# Patient Record
Sex: Male | Born: 1970
Health system: Southern US, Community
[De-identification: ages and names within clinical notes are randomized; demographics above are authoritative.]

## PROBLEM LIST (undated history)

## (undated) DIAGNOSIS — K219 Gastro-esophageal reflux disease without esophagitis: Secondary | ICD-10-CM

## (undated) DIAGNOSIS — N189 Chronic kidney disease, unspecified: Secondary | ICD-10-CM

## (undated) DIAGNOSIS — Z8719 Personal history of other diseases of the digestive system: Secondary | ICD-10-CM

## (undated) DIAGNOSIS — R519 Headache, unspecified: Secondary | ICD-10-CM

## (undated) DIAGNOSIS — Z9889 Other specified postprocedural states: Secondary | ICD-10-CM

## (undated) DIAGNOSIS — M1990 Unspecified osteoarthritis, unspecified site: Secondary | ICD-10-CM

## (undated) DIAGNOSIS — G4730 Sleep apnea, unspecified: Secondary | ICD-10-CM

## (undated) DIAGNOSIS — T8859XA Other complications of anesthesia, initial encounter: Secondary | ICD-10-CM

## (undated) DIAGNOSIS — R51 Headache: Secondary | ICD-10-CM

## (undated) DIAGNOSIS — Z87442 Personal history of urinary calculi: Secondary | ICD-10-CM

## (undated) DIAGNOSIS — T4145XA Adverse effect of unspecified anesthetic, initial encounter: Secondary | ICD-10-CM

## (undated) DIAGNOSIS — R112 Nausea with vomiting, unspecified: Secondary | ICD-10-CM

## (undated) HISTORY — PX: MENISCUS REPAIR: SHX5179

## (undated) HISTORY — PX: ELBOW ARTHROSCOPY: SUR87

## (undated) HISTORY — PX: WISDOM TOOTH EXTRACTION: SHX21

## (undated) HISTORY — PX: CHOLECYSTECTOMY: SHX55

## (undated) HISTORY — PX: OTHER SURGICAL HISTORY: SHX169

## (undated) HISTORY — PX: SHOULDER ARTHROSCOPY: SHX128

---

## 1998-05-16 ENCOUNTER — Encounter: Payer: Self-pay | Admitting: Emergency Medicine

## 1998-05-16 ENCOUNTER — Emergency Department (HOSPITAL_COMMUNITY): Admission: EM | Admit: 1998-05-16 | Discharge: 1998-05-16 | Payer: Self-pay | Admitting: Emergency Medicine

## 1999-11-01 ENCOUNTER — Emergency Department (HOSPITAL_COMMUNITY): Admission: EM | Admit: 1999-11-01 | Discharge: 1999-11-01 | Payer: Self-pay | Admitting: Emergency Medicine

## 1999-11-01 ENCOUNTER — Encounter: Payer: Self-pay | Admitting: Emergency Medicine

## 1999-12-03 ENCOUNTER — Ambulatory Visit (HOSPITAL_BASED_OUTPATIENT_CLINIC_OR_DEPARTMENT_OTHER): Admission: RE | Admit: 1999-12-03 | Discharge: 1999-12-03 | Payer: Self-pay | Admitting: Orthopedic Surgery

## 2001-01-17 ENCOUNTER — Emergency Department (HOSPITAL_COMMUNITY): Admission: EM | Admit: 2001-01-17 | Discharge: 2001-01-17 | Payer: Self-pay | Admitting: Emergency Medicine

## 2001-01-18 ENCOUNTER — Encounter: Payer: Self-pay | Admitting: Emergency Medicine

## 2001-05-22 ENCOUNTER — Encounter: Admission: RE | Admit: 2001-05-22 | Discharge: 2001-05-22 | Payer: Self-pay | Admitting: Family Medicine

## 2001-05-22 ENCOUNTER — Encounter: Payer: Self-pay | Admitting: Family Medicine

## 2004-05-12 ENCOUNTER — Ambulatory Visit (HOSPITAL_COMMUNITY): Admission: RE | Admit: 2004-05-12 | Discharge: 2004-05-12 | Payer: Self-pay | Admitting: Gastroenterology

## 2004-05-12 ENCOUNTER — Encounter (INDEPENDENT_AMBULATORY_CARE_PROVIDER_SITE_OTHER): Payer: Self-pay | Admitting: *Deleted

## 2005-05-06 ENCOUNTER — Emergency Department (HOSPITAL_COMMUNITY): Admission: EM | Admit: 2005-05-06 | Discharge: 2005-05-06 | Payer: Self-pay | Admitting: Emergency Medicine

## 2009-10-13 ENCOUNTER — Emergency Department (HOSPITAL_COMMUNITY): Admission: EM | Admit: 2009-10-13 | Discharge: 2009-10-13 | Payer: Self-pay | Admitting: Emergency Medicine

## 2009-10-14 ENCOUNTER — Ambulatory Visit (HOSPITAL_COMMUNITY): Admission: RE | Admit: 2009-10-14 | Discharge: 2009-10-14 | Payer: Self-pay | Admitting: Emergency Medicine

## 2009-10-20 ENCOUNTER — Ambulatory Visit (HOSPITAL_COMMUNITY): Admission: RE | Admit: 2009-10-20 | Discharge: 2009-10-20 | Payer: Self-pay | Admitting: General Surgery

## 2010-06-28 LAB — HEPATIC FUNCTION PANEL
Alkaline Phosphatase: 63 U/L (ref 39–117)
Bilirubin, Direct: 0 mg/dL (ref 0.0–0.3)
Indirect Bilirubin: 0.3 mg/dL (ref 0.3–0.9)
Total Protein: 7.1 g/dL (ref 6.0–8.3)

## 2010-06-28 LAB — POCT CARDIAC MARKERS
CKMB, poc: <1 ng/mL — ABNORMAL LOW (ref 1.0–8.0)
Troponin i, poc: <0.05 ng/mL (ref 0.00–0.09)

## 2010-06-28 LAB — CBC
MCHC: 33.8 g/dL (ref 30.0–36.0)
Platelets: 235 10*3/uL (ref 150–400)
RDW: 13.3 % (ref 11.5–15.5)
WBC: 9.8 10*3/uL (ref 4.0–10.5)

## 2010-06-28 LAB — DIFFERENTIAL
Basophils Absolute: 0.1 10*3/uL (ref 0.0–0.1)
Basophils Relative: 1 % (ref 0–1)
Lymphocytes Relative: 31 % (ref 12–46)
Neutro Abs: 5.8 10*3/uL (ref 1.7–7.7)
Neutrophils Relative %: 59 % (ref 43–77)

## 2010-06-28 LAB — BASIC METABOLIC PANEL
BUN: 11 mg/dL (ref 6–23)
Calcium: 9.1 mg/dL (ref 8.4–10.5)
Creatinine, Ser: 1.27 mg/dL (ref 0.4–1.5)
GFR calc non Af Amer: >60 mL/min (ref >60)
Potassium: 3.9 mEq/L (ref 3.5–5.1)

## 2010-06-28 LAB — LIPASE, BLOOD: Lipase: 34 U/L (ref 11–59)

## 2010-06-28 LAB — SURGICAL PCR SCREEN: MRSA, PCR: NEGATIVE

## 2010-08-28 NOTE — Op Note (Signed)
[name], Duane Barrera               ACCOUNT NO.:  [id number]   MEDICAL RECORD NO.:  [id number]          PATIENT TYPE:  AMB   LOCATION:  ENDO                         FACILITY:  [facility]   PHYSICIAN:  John C. Madilyn Fireman, M.D.    DATE OF BIRTH:  11-24-1970   DATE OF PROCEDURE:  05/12/2004  DATE OF DISCHARGE:                                 OPERATIVE REPORT   PROCEDURE:  Esophagogastroduodenoscopy with biopsy.   INDICATION FOR PROCEDURE:  Probable gastroesophageal reflux, with some  atypical features, specifically chest pain that has been relatively  refractory to proton pump inhibitor.  He has had negative cardiac workup and  abdominal ultrasound.  Procedure is to increase diagnostic certainty  regarding esophageal source of his chest pain.   PROCEDURE:  The patient was placed in the left lateral decubitus position  and placed on the pulse monitor, with continuous low-flow oxygen delivered  by nasal cannula.  He was sedated with 75 mcg IV fentanyl and 10 mg IV  Versed.  The Olympus video endoscope was advanced under direct vision into  the oropharynx and esophagus.  The esophagus was slightly tortuous but of  normal caliber, with the squamocolumnar line at 37 cm, above a short hiatal  hernia approximately 1.5 cm in length.  The GE junction did appear generally  patulous, and reflux of gastric secretions was seen during the procedure.  Just distal to the Z-line, there was a vaguely delineated erythematous  raised nodule approximately 4-6 mm in diameter.  This was biopsied, with two  or three pieces of tissue obtained.  Otherwise, there were no erosions,  ulcers, or any other visible esophageal or GE junction inflammation.  The  stomach was entered, and a small amount of liquid secretions were suctioned  from the fundus.  Retroflexed view of the cardia did not clearly demonstrate  the hiatal hernia and was otherwise unremarkable.  The fundus and body  appeared normal.  Within the antrum, there  were two or there elevated areas  of mucosa, with slight central indentation with a small amount of central  exudate.  These nodules were fairly nonspecific in appearance.  Two of them  were immediately adjacent to each other in the immediate prepyloric area,  and one of these was biopsied.  The pylorus was nondeformed and easily  allowed passage of the endoscope tip into the duodenum.  Both the bulb and  the second portion were well inspected and appeared to be within normal  limits.  The scope was then withdrawn and the patient returned to the  recovery room in stable condition.  He tolerated the procedure well, and  there were no immediate complications.   IMPRESSION:  1.  Gastroesophageal nodule.  2.  Small gastric nodules.  3.  Hiatal hernia, with patulous gastroesophageal junction.   PLAN:  Await all biopsies, and if negative for neoplasm and Helicobacter  pylori will probably resume proton pump inhibitor at a b.i.d. dosing.      JCH/MEDQ  D:  05/12/2004  T:  05/12/2004  Job:  [id number]   cc:   L. Lupe Carney,  M.D.  [address] [place]  [place] [address]  Fax: [phone]

## 2013-08-27 ENCOUNTER — Ambulatory Visit
Admission: RE | Admit: 2013-08-27 | Discharge: 2013-08-27 | Disposition: A | Discharge status: Home / Self Care | Payer: BC Managed Care – PPO | Source: Ambulatory Visit | Attending: Orthopaedic Surgery | Admitting: Orthopaedic Surgery

## 2013-08-27 ENCOUNTER — Other Ambulatory Visit: Payer: Self-pay | Admitting: Orthopaedic Surgery

## 2013-08-27 DIAGNOSIS — M79604 Pain in right leg: Secondary | ICD-10-CM

## 2013-09-04 ENCOUNTER — Other Ambulatory Visit: Payer: Self-pay | Admitting: Orthopaedic Surgery

## 2013-09-04 DIAGNOSIS — M25561 Pain in right knee: Secondary | ICD-10-CM

## 2013-09-09 ENCOUNTER — Ambulatory Visit
Admission: RE | Admit: 2013-09-09 | Discharge: 2013-09-09 | Disposition: A | Discharge status: Home / Self Care | Payer: BC Managed Care – PPO | Source: Ambulatory Visit | Attending: Orthopaedic Surgery | Admitting: Orthopaedic Surgery

## 2013-09-09 DIAGNOSIS — M25561 Pain in right knee: Secondary | ICD-10-CM

## 2015-04-01 ENCOUNTER — Other Ambulatory Visit: Payer: Self-pay | Admitting: Physician Assistant

## 2015-04-01 NOTE — H&P (Signed)
TOTAL KNEE ADMISSION H&P  Patient is being admitted for right total knee arthroplasty.  Subjective:  Chief Complaint:right knee pain.  HPI: Duane CrutchJohnny L Vandezande, 44 y.o. male, has a history of pain and functional disability in the right knee due to arthritis and has failed non-surgical conservative treatments for greater than 12 weeks to includeNSAID's and/or analgesics and corticosteriod injections.  Onset of symptoms was gradual, starting 2 years ago with rapidlly worsening course since that time. The patient noted prior procedures on the knee to include  arthroscopy and menisectomy on the right knee(s).  Patient currently rates pain in the right knee(s) at 8 out of 10 with activity. Patient has night pain, worsening of pain with activity and weight bearing, pain that interferes with activities of daily living and crepitus.  Patient has evidence of subchondral sclerosis and joint space narrowing by imaging studies. There is no active infection.  There are no active problems to display for this patient.  No past medical history on file.  No past surgical history on file.   (Not in a hospital admission) Allergies not on file  Social History  Substance Use Topics  . Smoking status: Not on file  . Smokeless tobacco: Not on file  . Alcohol Use: Not on file    No family history on file.   Review of Systems  Constitutional: Negative.   HENT: Negative.   Eyes: Negative.   Respiratory: Negative.   Cardiovascular: Negative.   Gastrointestinal: Negative.   Genitourinary: Negative.   Musculoskeletal: Positive for joint pain.  Skin: Negative.   Neurological: Negative.   Endo/Heme/Allergies: Negative.   Psychiatric/Behavioral: Negative.     Objective:  Physical Exam  Constitutional: He is oriented to person, place, and time. He appears well-developed and well-nourished.  HENT:  Head: Normocephalic and atraumatic.  Eyes: EOM are normal. Pupils are equal, round, and reactive to light.   Neck: Normal range of motion. Neck supple.  Cardiovascular: Normal rate and regular rhythm.   Respiratory: Effort normal and breath sounds normal.  GI: Soft. Bowel sounds are normal.  Musculoskeletal:  Examination of his right knee reveals range of motion 5-95 degrees.  Medial joint line tenderness.  Marked patellofemoral crepitus.  He is neurovascularly intact distally.    Neurological: He is alert and oriented to person, place, and time.  Skin: Skin is warm and dry.  Psychiatric: He has a normal mood and affect. His behavior is normal. Judgment and thought content normal.    Vital signs in last 24 hours: @VSRANGES @  Labs:   There is no height or weight on file to calculate BMI.   Imaging Review Plain radiographs demonstrate severe degenerative joint disease of the right knee(s). The overall alignment ismild varus. The bone quality appears to be fair for age and reported activity level.  Assessment/Plan:  End stage arthritis, right knee   The patient history, physical examination, clinical judgment of the provider and imaging studies are consistent with end stage degenerative joint disease of the right knee(s) and total knee arthroplasty is deemed medically necessary. The treatment options including medical management, injection therapy arthroscopy and arthroplasty were discussed at length. The risks and benefits of total knee arthroplasty were presented and reviewed. The risks due to aseptic loosening, infection, stiffness, patella tracking problems, thromboembolic complications and other imponderables were discussed. The patient acknowledged the explanation, agreed to proceed with the plan and consent was signed. Patient is being admitted for inpatient treatment for surgery, pain control, PT, OT, prophylactic antibiotics, VTE  prophylaxis, progressive ambulation and ADL's and discharge planning. The patient is planning to be discharged home with home health services

## 2015-04-03 ENCOUNTER — Encounter (HOSPITAL_COMMUNITY): Payer: Self-pay

## 2015-04-03 ENCOUNTER — Encounter (HOSPITAL_COMMUNITY)
Admission: RE | Admit: 2015-04-03 | Discharge: 2015-04-03 | Disposition: A | Discharge status: Home / Self Care | Payer: 59 | Source: Ambulatory Visit | Attending: Orthopedic Surgery | Admitting: Orthopedic Surgery

## 2015-04-03 DIAGNOSIS — Z0183 Encounter for blood typing: Secondary | ICD-10-CM | POA: Insufficient documentation

## 2015-04-03 DIAGNOSIS — M1711 Unilateral primary osteoarthritis, right knee: Secondary | ICD-10-CM | POA: Insufficient documentation

## 2015-04-03 DIAGNOSIS — Z01818 Encounter for other preprocedural examination: Secondary | ICD-10-CM | POA: Diagnosis not present

## 2015-04-03 DIAGNOSIS — Z01812 Encounter for preprocedural laboratory examination: Secondary | ICD-10-CM | POA: Insufficient documentation

## 2015-04-03 HISTORY — DX: Unspecified osteoarthritis, unspecified site: M19.90

## 2015-04-03 HISTORY — DX: Nausea with vomiting, unspecified: R11.2

## 2015-04-03 HISTORY — DX: Headache, unspecified: R51.9

## 2015-04-03 HISTORY — DX: Gastro-esophageal reflux disease without esophagitis: K21.9

## 2015-04-03 HISTORY — DX: Other specified postprocedural states: Z98.890

## 2015-04-03 HISTORY — DX: Personal history of other diseases of the digestive system: Z87.19

## 2015-04-03 HISTORY — DX: Adverse effect of unspecified anesthetic, initial encounter: T41.45XA

## 2015-04-03 HISTORY — DX: Other complications of anesthesia, initial encounter: T88.59XA

## 2015-04-03 HISTORY — DX: Headache: R51

## 2015-04-03 HISTORY — DX: Chronic kidney disease, unspecified: N18.9

## 2015-04-03 LAB — CBC WITH DIFFERENTIAL/PLATELET
BASOS PCT: 1 %
Basophils Absolute: 0.1 10*3/uL (ref 0.0–0.1)
Eosinophils Absolute: 0.3 10*3/uL (ref 0.0–0.7)
Eosinophils Relative: 4 %
HEMATOCRIT: 42.9 % (ref 39.0–52.0)
HEMOGLOBIN: 14.3 g/dL (ref 13.0–17.0)
LYMPHS ABS: 3.2 10*3/uL (ref 0.7–4.0)
Lymphocytes Relative: 38 %
MCH: 30.3 pg (ref 26.0–34.0)
MCHC: 33.3 g/dL (ref 30.0–36.0)
MCV: 90.9 fL (ref 78.0–100.0)
MONOS PCT: 7 %
Monocytes Absolute: 0.6 10*3/uL (ref 0.1–1.0)
NEUTROS ABS: 4.3 10*3/uL (ref 1.7–7.7)
NEUTROS PCT: 50 %
Platelets: 225 10*3/uL (ref 150–400)
RBC: 4.72 MIL/uL (ref 4.22–5.81)
RDW: 13 % (ref 11.5–15.5)
WBC: 8.5 10*3/uL (ref 4.0–10.5)

## 2015-04-03 LAB — APTT: aPTT: 38 seconds — ABNORMAL HIGH (ref 24–37)

## 2015-04-03 LAB — PROTIME-INR
INR: 1.01 (ref 0.00–1.49)
Prothrombin Time: 13.5 seconds (ref 11.6–15.2)

## 2015-04-03 LAB — TYPE AND SCREEN
ABO/RH(D): O NEG
Antibody Screen: NEGATIVE

## 2015-04-03 LAB — ABO/RH: ABO/RH(D): O NEG

## 2015-04-03 LAB — COMPREHENSIVE METABOLIC PANEL
ALBUMIN: 3.9 g/dL (ref 3.5–5.0)
ALK PHOS: 66 U/L (ref 38–126)
ALT: 15 U/L — AB (ref 17–63)
AST: 16 U/L (ref 15–41)
Anion gap: 8 (ref 5–15)
BILIRUBIN TOTAL: 1.2 mg/dL (ref 0.3–1.2)
BUN: 11 mg/dL (ref 6–20)
CALCIUM: 9.5 mg/dL (ref 8.9–10.3)
CO2: 27 mmol/L (ref 22–32)
CREATININE: 0.93 mg/dL (ref 0.61–1.24)
Chloride: 107 mmol/L (ref 101–111)
GFR calc Af Amer: >60 mL/min (ref >60)
GFR calc non Af Amer: >60 mL/min (ref >60)
GLUCOSE: 94 mg/dL (ref 65–99)
Potassium: 4 mmol/L (ref 3.5–5.1)
Sodium: 142 mmol/L (ref 135–145)
TOTAL PROTEIN: 7.1 g/dL (ref 6.5–8.1)

## 2015-04-03 LAB — SURGICAL PCR SCREEN
MRSA, PCR: NEGATIVE
Staphylococcus aureus: NEGATIVE

## 2015-04-03 NOTE — Progress Notes (Signed)
   04/03/15 1218  OBSTRUCTIVE SLEEP APNEA  Have you ever been diagnosed with sleep apnea through a sleep study? No  Do you snore loudly (loud enough to be heard through closed doors)?  1  Do you often feel tired, fatigued, or sleepy during the daytime (such as falling asleep during driving or talking to someone)? 1  Has anyone observed you stop breathing during your sleep? 0  Do you have, or are you being treated for high blood pressure? 0  BMI more than 35 kg/m2? 1  Age > 50 (1-yes) 0  Neck circumference greater than:Male 16 inches or larger, Male 17inches or larger? 1  Male Gender (Yes=1) 1  Obstructive Sleep Apnea Score 5  Score 5 or greater  Results sent to PCP

## 2015-04-05 LAB — URINE CULTURE

## 2015-04-15 MED ORDER — CHLORHEXIDINE GLUCONATE 4 % EX LIQD: 60.0000 mL | Freq: Once | CUTANEOUS | Status: DC

## 2015-04-15 MED ORDER — DEXTROSE 5 % IV SOLN
3.0000 g | INTRAVENOUS | Status: DC
  Filled 2015-04-15: qty 3000

## 2015-04-15 MED ORDER — LACTATED RINGERS IV SOLN
INTRAVENOUS | Status: DC
  Administered 2015-04-16: 10:00:00 via INTRAVENOUS

## 2015-04-15 MED ORDER — TRANEXAMIC ACID 1000 MG/10ML IV SOLN
1000.0000 mg | INTRAVENOUS | Status: AC
  Administered 2015-04-16: 1000 mg via INTRAVENOUS
  Filled 2015-04-15: qty 10

## 2015-04-16 ENCOUNTER — Encounter (HOSPITAL_COMMUNITY)
Admission: RE | Disposition: A | Discharge status: Home Health | Payer: Self-pay | Source: Ambulatory Visit | Attending: Orthopedic Surgery

## 2015-04-16 ENCOUNTER — Inpatient Hospital Stay (HOSPITAL_COMMUNITY): Payer: 59

## 2015-04-16 ENCOUNTER — Inpatient Hospital Stay (HOSPITAL_COMMUNITY): Payer: 59 | Admitting: Certified Registered Nurse Anesthetist

## 2015-04-16 ENCOUNTER — Encounter (HOSPITAL_COMMUNITY): Payer: Self-pay | Admitting: Certified Registered Nurse Anesthetist

## 2015-04-16 ENCOUNTER — Inpatient Hospital Stay (HOSPITAL_COMMUNITY)
Admission: RE | Admit: 2015-04-16 | Discharge: 2015-04-17 | DRG: 470 | Disposition: A | Discharge status: Home Health | Payer: 59 | Source: Ambulatory Visit | Attending: Orthopedic Surgery | Admitting: Orthopedic Surgery

## 2015-04-16 DIAGNOSIS — M1710 Unilateral primary osteoarthritis, unspecified knee: Secondary | ICD-10-CM | POA: Diagnosis present

## 2015-04-16 DIAGNOSIS — M25561 Pain in right knee: Secondary | ICD-10-CM | POA: Diagnosis present

## 2015-04-16 DIAGNOSIS — D62 Acute posthemorrhagic anemia: Secondary | ICD-10-CM | POA: Diagnosis not present

## 2015-04-16 DIAGNOSIS — M1711 Unilateral primary osteoarthritis, right knee: Secondary | ICD-10-CM | POA: Diagnosis not present

## 2015-04-16 DIAGNOSIS — Z96659 Presence of unspecified artificial knee joint: Secondary | ICD-10-CM

## 2015-04-16 DIAGNOSIS — R269 Unspecified abnormalities of gait and mobility: Secondary | ICD-10-CM | POA: Diagnosis not present

## 2015-04-16 DIAGNOSIS — M179 Osteoarthritis of knee, unspecified: Secondary | ICD-10-CM | POA: Diagnosis present

## 2015-04-16 DIAGNOSIS — K219 Gastro-esophageal reflux disease without esophagitis: Secondary | ICD-10-CM | POA: Diagnosis present

## 2015-04-16 DIAGNOSIS — N189 Chronic kidney disease, unspecified: Secondary | ICD-10-CM | POA: Diagnosis not present

## 2015-04-16 DIAGNOSIS — Z471 Aftercare following joint replacement surgery: Secondary | ICD-10-CM | POA: Diagnosis not present

## 2015-04-16 DIAGNOSIS — Z96651 Presence of right artificial knee joint: Secondary | ICD-10-CM | POA: Diagnosis not present

## 2015-04-16 HISTORY — PX: TOTAL KNEE ARTHROPLASTY: SHX125

## 2015-04-16 SURGERY — ARTHROPLASTY, KNEE, TOTAL
Anesthesia: Spinal | Laterality: Right

## 2015-04-16 MED ORDER — BISACODYL 5 MG PO TBEC: 5.0000 mg | DELAYED_RELEASE_TABLET | Freq: Every day | ORAL | Status: DC | PRN

## 2015-04-16 MED ORDER — ONDANSETRON HCL 4 MG PO TABS: 4.0000 mg | ORAL_TABLET | Freq: Three times a day (TID) | ORAL | Status: DC | PRN

## 2015-04-16 MED ORDER — FENTANYL CITRATE (PF) 250 MCG/5ML IJ SOLN
INTRAMUSCULAR | Status: AC
  Filled 2015-04-16: qty 5

## 2015-04-16 MED ORDER — BUPIVACAINE LIPOSOME 1.3 % IJ SUSP
20.0000 mL | INTRAMUSCULAR | Status: AC
  Administered 2015-04-16: 20 mL
  Filled 2015-04-16: qty 20

## 2015-04-16 MED ORDER — PROMETHAZINE HCL 25 MG/ML IJ SOLN: 6.2500 mg | INTRAMUSCULAR | Status: DC | PRN

## 2015-04-16 MED ORDER — PROPOFOL 10 MG/ML IV BOLUS
INTRAVENOUS | Status: AC
  Filled 2015-04-16: qty 20

## 2015-04-16 MED ORDER — DOCUSATE SODIUM 100 MG PO CAPS
100.0000 mg | ORAL_CAPSULE | Freq: Two times a day (BID) | ORAL | Status: DC
  Administered 2015-04-16 – 2015-04-17 (×2): 100 mg via ORAL
  Filled 2015-04-16 (×2): qty 1

## 2015-04-16 MED ORDER — MIDAZOLAM HCL 5 MG/5ML IJ SOLN
INTRAMUSCULAR | Status: DC | PRN
  Administered 2015-04-16: 2 mg via INTRAVENOUS

## 2015-04-16 MED ORDER — FENTANYL CITRATE (PF) 100 MCG/2ML IJ SOLN
INTRAMUSCULAR | Status: DC | PRN
  Administered 2015-04-16: 50 ug via INTRAVENOUS

## 2015-04-16 MED ORDER — ONDANSETRON HCL 4 MG/2ML IJ SOLN
INTRAMUSCULAR | Status: DC | PRN
  Administered 2015-04-16: 4 mg via INTRAVENOUS

## 2015-04-16 MED ORDER — LACTATED RINGERS IV SOLN
INTRAVENOUS | Status: DC
  Administered 2015-04-16: 10:00:00 via INTRAVENOUS

## 2015-04-16 MED ORDER — ZOLPIDEM TARTRATE 5 MG PO TABS: 5.0000 mg | ORAL_TABLET | Freq: Every evening | ORAL | Status: DC | PRN

## 2015-04-16 MED ORDER — ALUM & MAG HYDROXIDE-SIMETH 200-200-20 MG/5ML PO SUSP: 30.0000 mL | ORAL | Status: DC | PRN

## 2015-04-16 MED ORDER — MEPERIDINE HCL 25 MG/ML IJ SOLN: 6.2500 mg | INTRAMUSCULAR | Status: DC | PRN

## 2015-04-16 MED ORDER — PHENOL 1.4 % MT LIQD: 1.0000 | OROMUCOSAL | Status: DC | PRN

## 2015-04-16 MED ORDER — APIXABAN 2.5 MG PO TABS
ORAL_TABLET | ORAL | Status: DC
Start: 2015-04-16 — End: 2020-12-01

## 2015-04-16 MED ORDER — ONDANSETRON HCL 4 MG PO TABS: 4.0000 mg | ORAL_TABLET | Freq: Four times a day (QID) | ORAL | Status: DC | PRN

## 2015-04-16 MED ORDER — METHOCARBAMOL 1000 MG/10ML IJ SOLN
500.0000 mg | Freq: Four times a day (QID) | INTRAMUSCULAR | Status: DC | PRN
  Filled 2015-04-16: qty 5

## 2015-04-16 MED ORDER — SUCCINYLCHOLINE CHLORIDE 20 MG/ML IJ SOLN
INTRAMUSCULAR | Status: AC
  Filled 2015-04-16: qty 1

## 2015-04-16 MED ORDER — METOCLOPRAMIDE HCL 5 MG PO TABS: 5.0000 mg | ORAL_TABLET | Freq: Three times a day (TID) | ORAL | Status: DC | PRN

## 2015-04-16 MED ORDER — BUPIVACAINE HCL 0.5 % IJ SOLN
INTRAMUSCULAR | Status: DC | PRN
  Administered 2015-04-16: 10 mL

## 2015-04-16 MED ORDER — METHOCARBAMOL 500 MG PO TABS: 500.0000 mg | ORAL_TABLET | Freq: Four times a day (QID) | ORAL | Status: DC

## 2015-04-16 MED ORDER — LACTATED RINGERS IV SOLN: INTRAVENOUS | Status: DC

## 2015-04-16 MED ORDER — ONDANSETRON HCL 4 MG/2ML IJ SOLN
4.0000 mg | Freq: Four times a day (QID) | INTRAMUSCULAR | Status: DC | PRN
  Administered 2015-04-16 – 2015-04-17 (×2): 4 mg via INTRAVENOUS
  Filled 2015-04-16 (×2): qty 2

## 2015-04-16 MED ORDER — BISACODYL 10 MG RE SUPP: 10.0000 mg | Freq: Every day | RECTAL | Status: DC | PRN

## 2015-04-16 MED ORDER — PROPOFOL 500 MG/50ML IV EMUL
INTRAVENOUS | Status: DC | PRN
  Administered 2015-04-16: 100 ug/kg/min via INTRAVENOUS

## 2015-04-16 MED ORDER — PHENYLEPHRINE 40 MCG/ML (10ML) SYRINGE FOR IV PUSH (FOR BLOOD PRESSURE SUPPORT)
PREFILLED_SYRINGE | INTRAVENOUS | Status: AC
  Filled 2015-04-16: qty 10

## 2015-04-16 MED ORDER — MENTHOL 3 MG MT LOZG: 1.0000 | LOZENGE | OROMUCOSAL | Status: DC | PRN

## 2015-04-16 MED ORDER — MIDAZOLAM HCL 2 MG/2ML IJ SOLN
INTRAMUSCULAR | Status: AC
  Filled 2015-04-16: qty 2

## 2015-04-16 MED ORDER — ACETAMINOPHEN 325 MG PO TABS: 650.0000 mg | ORAL_TABLET | Freq: Four times a day (QID) | ORAL | Status: DC | PRN

## 2015-04-16 MED ORDER — MAGNESIUM CITRATE PO SOLN: 1.0000 | Freq: Once | ORAL | Status: DC | PRN

## 2015-04-16 MED ORDER — ONDANSETRON HCL 4 MG/2ML IJ SOLN
INTRAMUSCULAR | Status: AC
  Filled 2015-04-16: qty 2

## 2015-04-16 MED ORDER — CEFAZOLIN SODIUM-DEXTROSE 2-3 GM-% IV SOLR
INTRAVENOUS | Status: DC | PRN
  Administered 2015-04-16: 2 g via INTRAVENOUS

## 2015-04-16 MED ORDER — BUPIVACAINE IN DEXTROSE 0.75-8.25 % IT SOLN
INTRATHECAL | Status: DC | PRN
  Administered 2015-04-16: 2 mL via INTRATHECAL

## 2015-04-16 MED ORDER — BUPIVACAINE HCL (PF) 0.5 % IJ SOLN
INTRAMUSCULAR | Status: AC
  Filled 2015-04-16: qty 10

## 2015-04-16 MED ORDER — DIPHENHYDRAMINE HCL 12.5 MG/5ML PO ELIX: 12.5000 mg | ORAL_SOLUTION | ORAL | Status: DC | PRN

## 2015-04-16 MED ORDER — PHENYLEPHRINE HCL 10 MG/ML IJ SOLN
10.0000 mg | INTRAVENOUS | Status: DC | PRN
  Administered 2015-04-16: 10 ug/min via INTRAVENOUS

## 2015-04-16 MED ORDER — POTASSIUM CHLORIDE IN NACL 20-0.9 MEQ/L-% IV SOLN
INTRAVENOUS | Status: DC
  Administered 2015-04-16: 22:00:00 via INTRAVENOUS
  Filled 2015-04-16: qty 1000

## 2015-04-16 MED ORDER — HYDROMORPHONE HCL 1 MG/ML IJ SOLN
0.5000 mg | INTRAMUSCULAR | Status: DC | PRN
  Administered 2015-04-17: 1 mg via INTRAVENOUS
  Filled 2015-04-16: qty 1

## 2015-04-16 MED ORDER — CELECOXIB 200 MG PO CAPS
200.0000 mg | ORAL_CAPSULE | Freq: Two times a day (BID) | ORAL | Status: DC
  Administered 2015-04-16 – 2015-04-17 (×2): 200 mg via ORAL
  Filled 2015-04-16 (×2): qty 1

## 2015-04-16 MED ORDER — METOCLOPRAMIDE HCL 5 MG/ML IJ SOLN: 5.0000 mg | Freq: Three times a day (TID) | INTRAMUSCULAR | Status: DC | PRN

## 2015-04-16 MED ORDER — HYDROMORPHONE HCL 1 MG/ML IJ SOLN: 0.2500 mg | INTRAMUSCULAR | Status: DC | PRN

## 2015-04-16 MED ORDER — OXYCODONE HCL 5 MG PO TABS
5.0000 mg | ORAL_TABLET | ORAL | Status: DC | PRN
  Administered 2015-04-16 – 2015-04-17 (×6): 10 mg via ORAL
  Filled 2015-04-16 (×6): qty 2

## 2015-04-16 MED ORDER — CEFAZOLIN SODIUM-DEXTROSE 2-3 GM-% IV SOLR
2.0000 g | Freq: Four times a day (QID) | INTRAVENOUS | Status: AC
  Administered 2015-04-16 (×2): 2 g via INTRAVENOUS
  Filled 2015-04-16 (×2): qty 50

## 2015-04-16 MED ORDER — POLYETHYLENE GLYCOL 3350 17 G PO PACK: 17.0000 g | PACK | Freq: Every day | ORAL | Status: DC | PRN

## 2015-04-16 MED ORDER — OXYCODONE-ACETAMINOPHEN 5-325 MG PO TABS: 1.0000 | ORAL_TABLET | ORAL | Status: DC | PRN

## 2015-04-16 MED ORDER — LIDOCAINE HCL (CARDIAC) 20 MG/ML IV SOLN
INTRAVENOUS | Status: AC
  Filled 2015-04-16: qty 5

## 2015-04-16 MED ORDER — PHENYLEPHRINE HCL 10 MG/ML IJ SOLN
INTRAMUSCULAR | Status: DC | PRN
  Administered 2015-04-16 (×3): 80 ug via INTRAVENOUS

## 2015-04-16 MED ORDER — APIXABAN 2.5 MG PO TABS
2.5000 mg | ORAL_TABLET | Freq: Two times a day (BID) | ORAL | Status: DC
  Administered 2015-04-17: 2.5 mg via ORAL
  Filled 2015-04-16: qty 1

## 2015-04-16 MED ORDER — SODIUM CHLORIDE 0.9 % IJ SOLN
INTRAMUSCULAR | Status: DC | PRN
  Administered 2015-04-16: 40 mL via INTRAVENOUS

## 2015-04-16 MED ORDER — SODIUM CHLORIDE 0.9 % IR SOLN
Status: DC | PRN
Start: 2015-04-16 — End: 2015-04-16
  Administered 2015-04-16: 1000 mL

## 2015-04-16 MED ORDER — ACETAMINOPHEN 650 MG RE SUPP: 650.0000 mg | Freq: Four times a day (QID) | RECTAL | Status: DC | PRN

## 2015-04-16 MED ORDER — DEXAMETHASONE SODIUM PHOSPHATE 10 MG/ML IJ SOLN
10.0000 mg | Freq: Once | INTRAMUSCULAR | Status: AC
  Administered 2015-04-17: 10 mg via INTRAVENOUS
  Filled 2015-04-16: qty 1

## 2015-04-16 MED ORDER — METHOCARBAMOL 500 MG PO TABS
500.0000 mg | ORAL_TABLET | Freq: Four times a day (QID) | ORAL | Status: DC | PRN
  Administered 2015-04-16 – 2015-04-17 (×2): 500 mg via ORAL
  Filled 2015-04-16 (×2): qty 1

## 2015-04-16 SURGICAL SUPPLY — 72 items
APL SKNCLS STERI-STRIP NONHPOA (GAUZE/BANDAGES/DRESSINGS)
BANDAGE ACE 4X5 VEL STRL LF (GAUZE/BANDAGES/DRESSINGS) ×1 IMPLANT
BANDAGE ACE 6X5 VEL STRL LF (GAUZE/BANDAGES/DRESSINGS) ×1 IMPLANT
BANDAGE ELASTIC 4 VELCRO ST LF (GAUZE/BANDAGES/DRESSINGS) ×2 IMPLANT
BANDAGE ELASTIC 6 VELCRO ST LF (GAUZE/BANDAGES/DRESSINGS) ×2 IMPLANT
BANDAGE ESMARK 6X9 LF (GAUZE/BANDAGES/DRESSINGS) ×1 IMPLANT
BENZOIN TINCTURE PRP APPL 2/3 (GAUZE/BANDAGES/DRESSINGS) ×1 IMPLANT
BLADE SAG 18X100X1.27 (BLADE) ×4 IMPLANT
BNDG CMPR 9X6 STRL LF SNTH (GAUZE/BANDAGES/DRESSINGS) ×1
BNDG ESMARK 6X9 LF (GAUZE/BANDAGES/DRESSINGS) ×2
BOWL SMART MIX CTS (DISPOSABLE) ×2 IMPLANT
CAPT KNEE TOTAL 3 ×1 IMPLANT
CEMENT BONE SIMPLEX SPEEDSET (Cement) ×4 IMPLANT
CLSR STERI-STRIP ANTIMIC 1/2X4 (GAUZE/BANDAGES/DRESSINGS) ×1 IMPLANT
COVER SURGICAL LIGHT HANDLE (MISCELLANEOUS) ×2 IMPLANT
CUFF TOURNIQUET SINGLE 34IN LL (TOURNIQUET CUFF) ×2 IMPLANT
DRAPE EXTREMITY T 121X128X90 (DRAPE) ×2 IMPLANT
DRAPE IMP U-DRAPE 54X76 (DRAPES) ×2 IMPLANT
DRAPE PROXIMA HALF (DRAPES) ×2 IMPLANT
DRAPE U-SHAPE 47X51 STRL (DRAPES) ×2 IMPLANT
DRSG PAD ABDOMINAL 8X10 ST (GAUZE/BANDAGES/DRESSINGS) ×2 IMPLANT
DURAPREP 26ML APPLICATOR (WOUND CARE) ×4 IMPLANT
ELECT CAUTERY BLADE 6.4 (BLADE) ×2 IMPLANT
ELECT REM PT RETURN 9FT ADLT (ELECTROSURGICAL) ×2
ELECTRODE REM PT RTRN 9FT ADLT (ELECTROSURGICAL) ×1 IMPLANT
EVACUATOR 1/8 PVC DRAIN (DRAIN) ×1 IMPLANT
FACESHIELD WRAPAROUND (MASK) ×4 IMPLANT
FACESHIELD WRAPAROUND OR TEAM (MASK) ×2 IMPLANT
GAUZE SPONGE 4X4 12PLY STRL (GAUZE/BANDAGES/DRESSINGS) ×2 IMPLANT
GLOVE BIOGEL PI IND STRL 7.0 (GLOVE) ×1 IMPLANT
GLOVE BIOGEL PI INDICATOR 7.0 (GLOVE) ×1
GLOVE ORTHO TXT STRL SZ7.5 (GLOVE) ×2 IMPLANT
GLOVE SURG ORTHO 7.0 STRL STRW (GLOVE) ×2 IMPLANT
GOWN STRL REUS W/ TWL LRG LVL3 (GOWN DISPOSABLE) ×2 IMPLANT
GOWN STRL REUS W/ TWL XL LVL3 (GOWN DISPOSABLE) ×1 IMPLANT
GOWN STRL REUS W/TWL LRG LVL3 (GOWN DISPOSABLE) ×4
GOWN STRL REUS W/TWL XL LVL3 (GOWN DISPOSABLE) ×2
HANDPIECE INTERPULSE COAX TIP (DISPOSABLE) ×2
IMMOBILIZER KNEE 22 UNIV (SOFTGOODS) ×2 IMPLANT
IMMOBILIZER KNEE 24 THIGH 36 (MISCELLANEOUS) IMPLANT
IMMOBILIZER KNEE 24 UNIV (MISCELLANEOUS)
KIT BASIN OR (CUSTOM PROCEDURE TRAY) ×2 IMPLANT
KIT ROOM TURNOVER OR (KITS) ×2 IMPLANT
MANIFOLD NEPTUNE II (INSTRUMENTS) ×2 IMPLANT
NDL 18GX1X1/2 (RX/OR ONLY) (NEEDLE) ×1 IMPLANT
NDL HYPO 25GX1X1/2 BEV (NEEDLE) ×1 IMPLANT
NEEDLE 18GX1X1/2 (RX/OR ONLY) (NEEDLE) ×2 IMPLANT
NEEDLE HYPO 25GX1X1/2 BEV (NEEDLE) ×2 IMPLANT
NS IRRIG 1000ML POUR BTL (IV SOLUTION) ×2 IMPLANT
PACK TOTAL JOINT (CUSTOM PROCEDURE TRAY) ×2 IMPLANT
PACK UNIVERSAL I (CUSTOM PROCEDURE TRAY) ×2 IMPLANT
PAD ABD 8X10 STRL (GAUZE/BANDAGES/DRESSINGS) ×1 IMPLANT
PAD ARMBOARD 7.5X6 YLW CONV (MISCELLANEOUS) ×4 IMPLANT
PAD CAST 4YDX4 CTTN HI CHSV (CAST SUPPLIES) ×1 IMPLANT
PADDING CAST COTTON 4X4 STRL (CAST SUPPLIES) ×2
PADDING CAST COTTON 6X4 STRL (CAST SUPPLIES) ×1 IMPLANT
SET HNDPC FAN SPRY TIP SCT (DISPOSABLE) ×1 IMPLANT
SPONGE GAUZE 4X4 12PLY STER LF (GAUZE/BANDAGES/DRESSINGS) ×1 IMPLANT
STRIP CLOSURE SKIN 1/2X4 (GAUZE/BANDAGES/DRESSINGS) ×2 IMPLANT
SUCTION FRAZIER TIP 10 FR DISP (SUCTIONS) ×2 IMPLANT
SUT MNCRL AB 4-0 PS2 18 (SUTURE) ×2 IMPLANT
SUT VIC AB 0 CT1 27 (SUTURE)
SUT VIC AB 0 CT1 27XBRD ANBCTR (SUTURE) IMPLANT
SUT VIC AB 1 CTX 36 (SUTURE) ×2
SUT VIC AB 1 CTX36XBRD ANBCTR (SUTURE) ×1 IMPLANT
SUT VIC AB 2-0 CT1 27 (SUTURE) ×4
SUT VIC AB 2-0 CT1 TAPERPNT 27 (SUTURE) ×2 IMPLANT
SYR 50ML LL SCALE MARK (SYRINGE) ×2 IMPLANT
SYR CONTROL 10ML LL (SYRINGE) ×2 IMPLANT
TOWEL OR 17X24 6PK STRL BLUE (TOWEL DISPOSABLE) ×2 IMPLANT
TOWEL OR 17X26 10 PK STRL BLUE (TOWEL DISPOSABLE) ×2 IMPLANT
WATER STERILE IRR 1000ML POUR (IV SOLUTION) ×2 IMPLANT

## 2015-04-16 NOTE — Progress Notes (Signed)
Utilization review completed.  

## 2015-04-16 NOTE — Interval H&P Note (Signed)
History and Physical Interval Note:  04/16/2015 8:24 AM  Duane Barrera  has presented today for surgery, with the diagnosis of djd right knee  The various methods of treatment have been discussed with the patient and family. After consideration of risks, benefits and other options for treatment, the patient has consented to  Procedure(s): RIGHT TOTAL KNEE ARTHROPLASTY (Right) as a surgical intervention .  The patient's history has been reviewed, patient examined, no change in status, stable for surgery.  I have reviewed the patient's chart and labs.  Questions were answered to the patient's satisfaction.     Loreta Aveaniel F Tykesha Konicki

## 2015-04-16 NOTE — Evaluation (Signed)
Physical Therapy Evaluation Patient Details Name: Duane CrutchJohnny L Barrera MRN: [id number] DOB: 05/17/1970 Today's Date: 04/16/2015   History of Present Illness  45 y.o. male s/p right total knee arthroplasty.  Clinical Impression  Pt is s/p right TKA  Presenting with the deficits listed below (see PT Problem List). Demonstrates ability to ambulate up to 60 feet post op day #0 without any overt buckling from Rt knee. Moderate reliance on RW for support. Very motivated and anticipate he will progress quickly towards goals. Reviewed knee precautions, use of bone foam, and exercises. Pt will benefit from skilled PT to increase their independence and safety with mobility to allow discharge to the venue listed below.      Follow Up Recommendations Home health PT;Supervision - Intermittent    Equipment Recommendations  Rolling walker with 5" wheels    Recommendations for Other Services       Precautions / Restrictions Precautions Precautions: Knee Precaution Booklet Issued: Yes (comment) Precaution Comments: reviewed handout Required Braces or Orthoses: Knee Immobilizer - Right Restrictions Weight Bearing Restrictions: Yes RLE Weight Bearing: Weight bearing as tolerated      Mobility  Bed Mobility Overal bed mobility: Modified Independent             General bed mobility comments: use of rail  Transfers Overall transfer level: Needs assistance Equipment used: Rolling walker (2 wheeled) Transfers: Sit to/from Stand Sit to Stand: Min guard         General transfer comment: min guard for safety. VC for hand placement. Good control with descent.  Ambulation/Gait Ambulation/Gait assistance: Min guard Ambulation Distance (Feet): 60 Feet Assistive device: Rolling walker (2 wheeled) Gait Pattern/deviations: Step-to pattern;Step-through pattern;Decreased step length - left;Decreased stance time - right;Antalgic;Decreased stride length;Trunk flexed Gait velocity: decreased Gait  velocity interpretation: Below normal speed for age/gender General Gait Details: Educated on safe DME use with a rolling walker. VC for upright posture, forward gaze, and rt knee extension in stance phase for quad activation. No buckling noted. Moderate use of RW for support.  Stairs            Wheelchair Mobility    Modified Rankin (Stroke Patients Only)       Balance                                             Pertinent Vitals/Pain Pain Assessment: 0-10 Pain Score: 0-No pain Pain Location: Rt knee Pain Descriptors / Indicators: Discomfort Pain Intervention(s): Monitored during session;Repositioned    Home Living Family/patient expects to be discharged to:: Private residence Living Arrangements: Spouse/significant other;Children Available Help at Discharge: Family;Available 24 hours/day Type of Home: House Home Access: Stairs to enter Entrance Stairs-Rails: None Entrance Stairs-Number of Steps: 1 Home Layout: One level Home Equipment: Cane - single point      Prior Function Level of Independence: Independent               Hand Dominance   Dominant Hand: Right    Extremity/Trunk Assessment   Upper Extremity Assessment: Defer to OT evaluation           Lower Extremity Assessment: RLE deficits/detail RLE Deficits / Details: decreased strength and ROM as expected post op       Communication   Communication: No difficulties  Cognition Arousal/Alertness: Awake/alert Behavior During Therapy: WFL for tasks assessed/performed Overall Cognitive Status: Within Functional Limits for  tasks assessed                      General Comments General comments (skin integrity, edema, etc.): reviewed positioning for optimal healing    Exercises Total Joint Exercises Ankle Circles/Pumps: AROM;Both;10 reps;Seated Quad Sets: Strengthening;Both;10 reps;Seated Long Arc Quad: Strengthening;Right;Seated;AROM;5 reps Knee Flexion:  AROM;Right;5 reps;Seated      Assessment/Plan    PT Assessment Patient needs continued PT services  PT Diagnosis Difficulty walking;Abnormality of gait;Acute pain   PT Problem List Decreased strength;Decreased range of motion;Decreased activity tolerance;Decreased balance;Decreased mobility;Decreased knowledge of use of DME;Decreased knowledge of precautions;Pain  PT Treatment Interventions DME instruction;Gait training;Stair training;Functional mobility training;Therapeutic activities;Balance training;Therapeutic exercise;Neuromuscular re-education;Patient/family education;Modalities   PT Goals (Current goals can be found in the Care Plan section) Acute Rehab PT Goals Patient Stated Goal: Go home tomorrow PT Goal Formulation: With patient Time For Goal Achievement: 04/23/15 Potential to Achieve Goals: Good    Frequency 7X/week   Barriers to discharge        Co-evaluation               End of Session   Activity Tolerance: Patient tolerated treatment well Patient left: in chair;with call bell/phone within reach;with family/visitor present;with SCD's reapplied Nurse Communication: Mobility status         Time: 1610-9604 PT Time Calculation (min) (ACUTE ONLY): 20 min   Charges:   PT Evaluation $PT Eval Low Complexity: 1 Procedure     PT G CodesBerton Mount 04/16/2015, 6:12 PM  Charlsie Merles,  [phone]

## 2015-04-16 NOTE — Anesthesia Postprocedure Evaluation (Signed)
Anesthesia Post Note  Patient: Duane CrutchJohnny L Barrera  Procedure(s) Performed: Procedure(s) (LRB): RIGHT TOTAL KNEE ARTHROPLASTY (Right)  Patient location during evaluation: PACU Anesthesia Type: Spinal Level of consciousness: oriented and awake and alert Pain management: pain level controlled Vital Signs Assessment: post-procedure vital signs reviewed and stable Respiratory status: spontaneous breathing, respiratory function stable and patient connected to nasal cannula oxygen Cardiovascular status: blood pressure returned to baseline and stable Postop Assessment: no headache, no backache and spinal receding Anesthetic complications: no    Last Vitals:  Filed Vitals:   04/16/15 1500 04/16/15 1523  BP: 122/84 116/80  Pulse: 65 82  Temp: 36.4 C 36.5 C  Resp: 13 16    Last Pain:  Filed Vitals:   04/16/15 1524  PainSc: 6                  Shelton SilvasKevin D Ramiel Forti

## 2015-04-16 NOTE — Transfer of Care (Signed)
Immediate Anesthesia Transfer of Care Note  Patient: Duane Barrera  Procedure(s) Performed: Procedure(s): RIGHT TOTAL KNEE ARTHROPLASTY (Right)  Patient Location: PACU  Anesthesia Type:Spinal  Level of Consciousness: awake, alert , oriented and patient cooperative  Airway & Oxygen Therapy: Patient Spontanous Breathing and Patient connected to nasal cannula oxygen  Post-op Assessment: Report given to RN and Post -op Vital signs reviewed and stable--Spinal sensory level at T10.  Post vital signs: Reviewed and stable  Last Vitals:  Filed Vitals:   04/16/15 0931 04/16/15 1248  BP: 137/88   Pulse: 81   Temp: 36.6 C 36.4 C  Resp: 20     Complications: No apparent anesthesia complications

## 2015-04-16 NOTE — Discharge Summary (Addendum)
Patient ID: Duane Barrera MRN: [id number] DOB/AGE: 46-Jan-1972 45 y.o.  Admit date: 04/16/2015 Discharge date: 04/17/2015  Admission Diagnoses:  Active Problems:   DJD (degenerative joint disease) of knee   Discharge Diagnoses:  Same  Past Medical History  Diagnosis Date  . Complication of anesthesia   . PONV (postoperative nausea and vomiting)   . GERD (gastroesophageal reflux disease)     otc tums  . Chronic kidney disease     h/o kidneys 2003-04  . History of hiatal hernia   . Headache     h/o migraines  . Arthritis     both knees and elbow    Surgeries: Procedure(s): RIGHT TOTAL KNEE ARTHROPLASTY on 04/16/2015   Consultants:    Discharged Condition: Improved  Hospital Course: Duane Barrera is an 45 y.o. male who was admitted 04/16/2015 for operative treatment of primary localized osteoarthritis right knee. Patient has severe unremitting pain that affects sleep, daily activities, and work/hobbies. After pre-op clearance the patient was taken to the operating room on 04/16/2015 and underwent  Procedure(s): RIGHT TOTAL KNEE ARTHROPLASTY.  Patient with a pre-op Hb of 14.3 developed ABLA on pod#1 with a Hb of 12.1.  He is currently stable but we will continue to follow.  Patient was given perioperative antibiotics:      Anti-infectives    Start     Dose/Rate Route Frequency Ordered Stop   04/16/15 1700  ceFAZolin (ANCEF) IVPB 2 g/50 mL premix     2 g 100 mL/hr over 30 Minutes Intravenous Every 6 hours 04/16/15 1536 04/16/15 2305   04/16/15 1100  ceFAZolin (ANCEF) 3 g in dextrose 5 % 50 mL IVPB  Status:  Discontinued     3 g 160 mL/hr over 30 Minutes Intravenous To ShortStay Surgical 04/15/15 0958 04/16/15 1516       Patient was given sequential compression devices, early ambulation, and chemoprophylaxis to prevent DVT.  Patient benefited maximally from hospital stay and there were no complications.    Recent vital signs:  Patient Vitals for the past 24 hrs:  BP Temp  Temp src Pulse Resp SpO2 Height Weight  04/17/15 0541 121/70 mmHg 98.8 F (37.1 C) Oral 100 16 97 % - -  04/16/15 2127 123/70 mmHg 99.6 F (37.6 C) Oral 100 18 100 % - -  04/16/15 1523 116/80 mmHg 97.7 F (36.5 C) - 82 16 100 % - -  04/16/15 1500 122/84 mmHg 97.6 F (36.4 C) - 65 13 100 % - -  04/16/15 1445 112/76 mmHg - - 70 13 100 % - -  04/16/15 1430 117/76 mmHg - - 64 14 100 % - -  04/16/15 1415 115/77 mmHg - - (!) 59 (!) 9 100 % - -  04/16/15 1400 111/79 mmHg - - (!) 58 15 100 % - -  04/16/15 1345 105/79 mmHg - - 72 15 100 % - -  04/16/15 1330 106/75 mmHg - - (!) 58 12 100 % - -  04/16/15 1315 96/68 mmHg - - 60 (!) 8 100 % - -  04/16/15 1302 92/61 mmHg - - 68 11 100 % - -  04/16/15 1300 92/61 mmHg - - 70 11 100 % - -  04/16/15 1248 101/61 mmHg 97.5 F (36.4 C) - 82 18 99 % - -  04/16/15 0931 137/88 mmHg 97.8 F (36.6 C) Oral 81 20 100 % 5\' 10"  (1.778 m) 124.739 kg (275 lb)     Recent laboratory studies:   Recent  Labs  04/17/15 0318  WBC 10.6*  HGB 12.1*  HCT 36.4*  PLT 173  NA 138  K 3.8  CL 104  CO2 26  BUN 13  CREATININE 1.09  GLUCOSE 122*  CALCIUM 8.2*     Discharge Medications:     Medication List    TAKE these medications        apixaban 2.5 MG Tabs tablet  Commonly known as:  ELIQUIS  Take 1 tab po q12 hours x 14 days following surgery to prevent blood clots     bisacodyl 5 MG EC tablet  Commonly known as:  DULCOLAX  Take 1 tablet (5 mg total) by mouth daily as needed for moderate constipation.     methocarbamol 500 MG tablet  Commonly known as:  ROBAXIN  Take 1 tablet (500 mg total) by mouth 4 (four) times daily.     ondansetron 4 MG tablet  Commonly known as:  ZOFRAN  Take 1 tablet (4 mg total) by mouth every 8 (eight) hours as needed for nausea or vomiting.     oxyCODONE-acetaminophen 5-325 MG tablet  Commonly known as:  ROXICET  Take 1-2 tablets by mouth every 4 (four) hours as needed.        Diagnostic Studies: Dg Knee Right  Port  04/16/2015  CLINICAL DATA:  Status post right total knee arthroplasty EXAM: PORTABLE RIGHT KNEE - 1-2 VIEW COMPARISON:  None FINDINGS: Postoperative changes from right total knee arthroplasty identified. No periprosthetic fracture or subluxation. Gas is identified within the surrounding soft tissues. IMPRESSION: 1. No complications following right total knee arthroplasty. Electronically Signed   By: Signa Kellaylor  Stroud M.D.   On: 04/16/2015 14:15    Disposition: Final discharge disposition not confirmed    Follow-up Information    Follow up with Loreta Aveaniel F Murphy, MD. Schedule an appointment as soon as possible for a visit in 2 weeks.   Specialty:  Orthopedic Surgery   Contact information:   [address] [place] [address] [phone]        Signed: Otilio SaberM Lindsey Stanbery 04/17/2015, 6:03 AM

## 2015-04-16 NOTE — Anesthesia Procedure Notes (Signed)
Spinal Patient location during procedure: OR Start time: 04/16/2015 11:02 AM End time: 04/16/2015 11:04 AM Staffing Anesthesiologist: Suella Broad D Performed by: anesthesiologist  Preanesthetic Checklist Completed: patient identified, site marked, surgical consent, pre-op evaluation, timeout performed, IV checked, risks and benefits discussed and monitors and equipment checked Spinal Block Patient position: sitting Prep: ChloraPrep Patient monitoring: heart rate, continuous pulse ox, blood pressure and cardiac monitor Approach: midline Location: L4-5 Injection technique: single-shot Needle Needle type: Whitacre and Introducer  Needle gauge: 24 G Needle length: 9 cm Additional Notes Negative paresthesia. Negative blood return. Positive free-flowing CSF. Expiration date of kit checked and confirmed. Patient tolerated procedure well, without complications.

## 2015-04-16 NOTE — Op Note (Signed)
NAMMerry Lofty:  Barrera, Duane               ACCOUNT NO.:  [id number]  MEDICAL RECORD NO.:  [id number]  LOCATION:  [facility]                         FACILITY:  [facility]  PHYSICIAN:  Loreta Aveaniel F. Taha Dimond, M.D. DATE OF BIRTH:  [birth year]  DATE OF PROCEDURE:  04/16/2015 DATE OF DISCHARGE:                              OPERATIVE REPORT   PREOPERATIVE DIAGNOSIS:  Right knee end-stage arthritis, primarily localized.  POSTOPERATIVE DIAGNOSIS:  Right knee end-stage arthritis, primarily localized.  PROCEDURE: 1. Modified minimally invasive right total knee replacement, Stryker     triathlon prosthesis. 2. A cemented pegged posterior stabilized #5 femoral component. 3. Cemented #6 tibial component, 9 mm posterior stabilized     polyethylene insert. 4. Cemented resurfacing 38-mm patellar component.  SURGEON:  Loreta Aveaniel F. Amar Keenum, M.D.  ASSISTANT:  Mikey KirschnerLindsey Stanberry, PA., present throughout the entire case and necessary for timely completion of procedure.  ANESTHESIA:  Spinal.  BLOOD LOSS:  Minimal.  SPECIMENS:  None.  COMPLICATIONS:  None.  DRESSINGS:  Soft compressive knee immobilizer.  TOURNIQUET TIME:  45 minutes.  DESCRIPTION OF PROCEDURE:  The patient was brought to the operating room and after adequate anesthesia had been obtained, tourniquet was applied, prepped and draped in usual sterile fashion.  Exsanguinated with elevation of Esmarch.  Tourniquet inflated to 350 mmHg.  Straight incision above the patella down to tibial tubercle.  A medial arthrotomy vastus splitting.  Intramedullary guide distal femur.  An 8-mm resection and 5 degrees of valgus.  Using epicondylar axis, the femur was sized, cut, and fitted for posterior stabilized pegged #5 component. Extramedullary guide, proximal tibia 3-degree posterior slope cut.  The spur was removed throughout.  Size was #6 component.  Patella exposed. Spurs removed.  Posterior 10 mm removed.  Drilled, sized, and fitted for a 38-mm component.   Trials put in place with a 6 PS by 9 mm tibial insert.  With this, I was very pleased with biomechanical axis, nicely balanced.  Flexion and extension good motion and stability.  Good patellar tracking.  Tibia was marked for rotation and reamed.  All trials removed.  Copious irrigation with a pulse irrigating device. Cement prepared, placed on all components, firmly seated.  Polyethylene attached to tibia, knee reduced.  Patella clamp.  Once cement hardened, the knee was irrigated again.  Soft tissues objective external. Arthrotomy closed with Vicryl. Subcutaneous and subcuticular closure.  Margins were injected with Marcaine.  Sterile compressive dressing applied.  Tourniquet deflated removed.  Knee immobilizer applied.  Anesthesia reversed.  Brought to the recovery room.  Tolerated the surgery well.  No complications.     Loreta Aveaniel F. Ron Junco, M.D.     DFM/MEDQ  D:  04/16/2015  T:  04/16/2015  Job:  [id number]

## 2015-04-16 NOTE — Anesthesia Preprocedure Evaluation (Signed)
Anesthesia Evaluation  Patient identified by MRN, date of birth, ID band Patient awake    Reviewed: Allergy & Precautions, NPO status , Patient's Chart, lab work & pertinent test results  History of Anesthesia Complications (+) PONV and history of anesthetic complications  Airway Mallampati: III  TM Distance: >3 FB Neck ROM: Full    Dental  (+) Teeth Intact   Pulmonary neg pulmonary ROS,    breath sounds clear to auscultation       Cardiovascular negative cardio ROS   Rhythm:Regular Rate:Normal     Neuro/Psych  Headaches, negative psych ROS   GI/Hepatic GERD  Medicated,  Endo/Other    Renal/GU Renal InsufficiencyRenal disease  negative genitourinary   Musculoskeletal  (+) Arthritis ,   Abdominal   Peds negative pediatric ROS (+)  Hematology negative hematology ROS (+)   Anesthesia Other Findings   Reproductive/Obstetrics                             Lab Results  Component Value Date   WBC 8.5 04/03/2015   HGB 14.3 04/03/2015   HCT 42.9 04/03/2015   MCV 90.9 04/03/2015   PLT 225 04/03/2015   Lab Results  Component Value Date   INR 1.01 04/03/2015   03/2015 EKG: normal sinus rhythm.   Anesthesia Physical Anesthesia Plan  ASA: III  Anesthesia Plan: Spinal   Post-op Pain Management:    Induction: Intravenous  Airway Management Planned: Natural Airway  Additional Equipment:   Intra-op Plan:   Post-operative Plan:   Informed Consent: I have reviewed the patients History and Physical, chart, labs and discussed the procedure including the risks, benefits and alternatives for the proposed anesthesia with the patient or authorized representative who has indicated his/her understanding and acceptance.     Plan Discussed with: CRNA  Anesthesia Plan Comments:         Anesthesia Quick Evaluation

## 2015-04-16 NOTE — H&P (View-Only) (Signed)
TOTAL KNEE ADMISSION H&P  Patient is being admitted for right total knee arthroplasty.  Subjective:  Chief Complaint:right knee pain.  HPI: Duane Barrera, 44 y.o. male, has a history of pain and functional disability in the right knee due to arthritis and has failed non-surgical conservative treatments for greater than 12 weeks to includeNSAID's and/or analgesics and corticosteriod injections.  Onset of symptoms was gradual, starting 2 years ago with rapidlly worsening course since that time. The patient noted prior procedures on the knee to include  arthroscopy and menisectomy on the right knee(s).  Patient currently rates pain in the right knee(s) at 8 out of 10 with activity. Patient has night pain, worsening of pain with activity and weight bearing, pain that interferes with activities of daily living and crepitus.  Patient has evidence of subchondral sclerosis and joint space narrowing by imaging studies. There is no active infection.  There are no active problems to display for this patient.  No past medical history on file.  No past surgical history on file.   (Not in a hospital admission) Allergies not on file  Social History  Substance Use Topics  . Smoking status: Not on file  . Smokeless tobacco: Not on file  . Alcohol Use: Not on file    No family history on file.   Review of Systems  Constitutional: Negative.   HENT: Negative.   Eyes: Negative.   Respiratory: Negative.   Cardiovascular: Negative.   Gastrointestinal: Negative.   Genitourinary: Negative.   Musculoskeletal: Positive for joint pain.  Skin: Negative.   Neurological: Negative.   Endo/Heme/Allergies: Negative.   Psychiatric/Behavioral: Negative.     Objective:  Physical Exam  Constitutional: He is oriented to person, place, and time. He appears well-developed and well-nourished.  HENT:  Head: Normocephalic and atraumatic.  Eyes: EOM are normal. Pupils are equal, round, and reactive to light.   Neck: Normal range of motion. Neck supple.  Cardiovascular: Normal rate and regular rhythm.   Respiratory: Effort normal and breath sounds normal.  GI: Soft. Bowel sounds are normal.  Musculoskeletal:  Examination of his right knee reveals range of motion 5-95 degrees.  Medial joint line tenderness.  Marked patellofemoral crepitus.  He is neurovascularly intact distally.    Neurological: He is alert and oriented to person, place, and time.  Skin: Skin is warm and dry.  Psychiatric: He has a normal mood and affect. His behavior is normal. Judgment and thought content normal.    Vital signs in last 24 hours: @VSRANGES@  Labs:   There is no height or weight on file to calculate BMI.   Imaging Review Plain radiographs demonstrate severe degenerative joint disease of the right knee(s). The overall alignment ismild varus. The bone quality appears to be fair for age and reported activity level.  Assessment/Plan:  End stage arthritis, right knee   The patient history, physical examination, clinical judgment of the provider and imaging studies are consistent with end stage degenerative joint disease of the right knee(s) and total knee arthroplasty is deemed medically necessary. The treatment options including medical management, injection therapy arthroscopy and arthroplasty were discussed at length. The risks and benefits of total knee arthroplasty were presented and reviewed. The risks due to aseptic loosening, infection, stiffness, patella tracking problems, thromboembolic complications and other imponderables were discussed. The patient acknowledged the explanation, agreed to proceed with the plan and consent was signed. Patient is being admitted for inpatient treatment for surgery, pain control, PT, OT, prophylactic antibiotics, VTE   prophylaxis, progressive ambulation and ADL's and discharge planning. The patient is planning to be discharged home with home health services

## 2015-04-17 ENCOUNTER — Encounter (HOSPITAL_COMMUNITY): Payer: Self-pay | Admitting: Orthopedic Surgery

## 2015-04-17 LAB — CBC
HCT: 36.4 % — ABNORMAL LOW (ref 39.0–52.0)
Hemoglobin: 12.1 g/dL — ABNORMAL LOW (ref 13.0–17.0)
MCH: 30 pg (ref 26.0–34.0)
MCHC: 33.2 g/dL (ref 30.0–36.0)
MCV: 90.3 fL (ref 78.0–100.0)
PLATELETS: 173 10*3/uL (ref 150–400)
RBC: 4.03 MIL/uL — AB (ref 4.22–5.81)
RDW: 12.8 % (ref 11.5–15.5)
WBC: 10.6 10*3/uL — AB (ref 4.0–10.5)

## 2015-04-17 LAB — BASIC METABOLIC PANEL
ANION GAP: 8 (ref 5–15)
BUN: 13 mg/dL (ref 6–20)
CO2: 26 mmol/L (ref 22–32)
Calcium: 8.2 mg/dL — ABNORMAL LOW (ref 8.9–10.3)
Chloride: 104 mmol/L (ref 101–111)
Creatinine, Ser: 1.09 mg/dL (ref 0.61–1.24)
GLUCOSE: 122 mg/dL — AB (ref 65–99)
POTASSIUM: 3.8 mmol/L (ref 3.5–5.1)
SODIUM: 138 mmol/L (ref 135–145)

## 2015-04-17 NOTE — Progress Notes (Signed)
Physical Therapy Treatment Patient Details Name: Duane CrutchJohnny L Barrera MRN: [id number] DOB: 12/23/1970 Today's Date: 04/17/2015    History of Present Illness 45 y.o. male s/p right total knee arthroplasty.    PT Comments    Pt with nausea without emesis this am but willing to participate in therapy. Pt with ability to ambulate 200 ft and ascend/descend 2 steps with safe technique and use of RW. Overall mobility level of supervision/mod I this session. From a mobility standpoint anticipate patient will be ready for DC home when medically ready.   Continue to progress as tolerated until d/c.  Follow Up Recommendations  Home health PT;Supervision - Intermittent     Equipment Recommendations  Rolling walker with 5" wheels    Recommendations for Other Services       Precautions / Restrictions Precautions Precautions: Knee Precaution Booklet Issued: Yes (comment) Precaution Comments: reviewed handout Required Braces or Orthoses: Knee Immobilizer - Right Restrictions Weight Bearing Restrictions: Yes RLE Weight Bearing: Weight bearing as tolerated    Mobility  Bed Mobility Overal bed mobility: Modified Independent Bed Mobility: Supine to Sit     Supine to sit: Modified independent (Device/Increase time) Sit to supine: Min assist   General bed mobility comments: no use of bed rails and HOB flat; increased time needed   Transfers Overall transfer level: Needs assistance Equipment used: Rolling walker (2 wheeled) Transfers: Sit to/from Stand Sit to Stand: Supervision         General transfer comment: supervision for safety; vc for hand placement  Ambulation/Gait Ambulation/Gait assistance: Supervision;Min guard Ambulation Distance (Feet): 150 Feet Assistive device: Rolling walker (2 wheeled) Gait Pattern/deviations: Step-to pattern;Step-through pattern;Decreased stance time - right;Decreased step length - left;Trunk flexed Gait velocity: decreased Gait velocity  interpretation: Below normal speed for age/gender General Gait Details: vc for sequencing of gait pattern with AD and for maintaining upright psoture; pt with improved bilat step lengths after initial ~30 ft   Stairs Stairs: Yes Stairs assistance: Min guard Stair Management: No rails;Backwards;With walker Number of Stairs: 2 General stair comments: educated on technique and need for assistance stabilizing RW; pt feels comfortable with technique and reported wife will be home to help him  Wheelchair Mobility    Modified Rankin (Stroke Patients Only)       Balance Overall balance assessment: Needs assistance Sitting-balance support: Feet supported Sitting balance-Leahy Scale: Good     Standing balance support: No upper extremity supported Standing balance-Leahy Scale: Fair Standing balance comment: static standing with no unsteadiness                    Cognition Arousal/Alertness: Awake/alert Behavior During Therapy: WFL for tasks assessed/performed Overall Cognitive Status: Within Functional Limits for tasks assessed                      Exercises Total Joint Exercises Quad Sets: Strengthening;Both;10 reps;Supine Heel Slides: AROM;Right;10 reps;Supine Hip ABduction/ADduction: AROM;Right;Supine;10 reps Long Arc Quad: Strengthening;Right;Seated;AROM;10 reps Knee Flexion: AROM;Right;Seated;10 reps Goniometric ROM: 0-70    General Comments        Pertinent Vitals/Pain Pain Assessment: Faces Faces Pain Scale: Hurts even more Pain Location: R thigh Pain Descriptors / Indicators: Grimacing;Sore Pain Intervention(s): Limited activity within patient's tolerance;Premedicated before session;Monitored during session    Home Living Family/patient expects to be discharged to:: Private residence Living Arrangements: Spouse/significant other;Children Available Help at Discharge: Family;Available 24 hours/day (initially) Type of Home: House Home Access:  Stairs to enter Entrance Stairs-Rails: None Home Layout: One  level Home Equipment: Cane - single point      Prior Function Level of Independence: Independent          PT Goals (current goals can now be found in the care plan section) Acute Rehab PT Goals Patient Stated Goal: Go home tomorrow PT Goal Formulation: With patient Time For Goal Achievement: 04/23/15 Potential to Achieve Goals: Good Progress towards PT goals: Progressing toward goals    Frequency  7X/week    PT Plan Current plan remains appropriate    Co-evaluation             End of Session Equipment Utilized During Treatment: Gait belt Activity Tolerance: Patient tolerated treatment well Patient left: with call bell/phone within reach;Other (comment) (session with OT began when exiting room)     Time: 1191-4782 PT Time Calculation (min) (ACUTE ONLY): 27 min  Charges:  $Gait Training: 8-22 mins $Therapeutic Exercise: 8-22 mins                    G Codes:      Derek Mound, PTA Pager: [phone]   04/17/2015, 9:36 AM

## 2015-04-17 NOTE — Progress Notes (Signed)
Subjective: 1 Day Post-Op Procedure(s) (LRB): RIGHT TOTAL KNEE ARTHROPLASTY (Right) Patient reports pain as mild.  Patient reported slight nausea after dinner last night but this has resolved.  No vomiting.  No lightheadedness/dizziness, chest pain/sob.  Positive flatus/bm.  Tolerating diet.  Objective: Vital signs in last 24 hours: Temp:  [97.5 F (36.4 C)-99.6 F (37.6 C)] 98.8 F (37.1 C) (01/05 0541) Pulse Rate:  [58-100] 100 (01/05 0541) Resp:  [8-20] 16 (01/05 0541) BP: (92-137)/(61-88) 121/70 mmHg (01/05 0541) SpO2:  [97 %-100 %] 97 % (01/05 0541) Weight:  [124.739 kg (275 lb)] 124.739 kg (275 lb) (01/04 0931)  Intake/Output from previous day: 01/04 0701 - 01/05 0700 In: 600 [I.V.:600] Out: 426 [Urine:325; Stool:1; Blood:100] Intake/Output this shift: Total I/O In: -  Out: 200 [Urine:200]   Recent Labs  04/17/15 0318  HGB 12.1*    Recent Labs  04/17/15 0318  WBC 10.6*  RBC 4.03*  HCT 36.4*  PLT 173    Recent Labs  04/17/15 0318  NA 138  K 3.8  CL 104  CO2 26  BUN 13  CREATININE 1.09  GLUCOSE 122*  CALCIUM 8.2*   No results for input(s): LABPT, INR in the last 72 hours.  Neurologically intact Neurovascular intact Sensation intact distally Intact pulses distally Dorsiflexion/Plantar flexion intact Compartment soft  Negative homans bilaterally  Assessment/Plan: 1 Day Post-Op Procedure(s) (LRB): RIGHT TOTAL KNEE ARTHROPLASTY (Right) Advance diet Up with therapy D/C IV fluids Discharge home with home health today following first or second PT session WBAT RLE ABLA-mild and stable Dry dressing change prn   Otilio SaberM Lindsey Stanbery 04/17/2015, 6:07 AM

## 2015-04-17 NOTE — Evaluation (Signed)
Occupational Therapy Evaluation Patient Details Name: Duane Barrera MRN: [id number] DOB: 07/09/1970 Today's Date: 04/17/2015    History of Present Illness 45 y.o. male s/p right total knee arthroplasty.   Clinical Impression   Pt reports he was independent with ADLs PTA. OT eval limited by nausea without emesis (RN aware). Pt currently overall supervision for safety with ADLs and functional mobility. Began safety and ADL education with pt; he verbalized understanding but seemed distracted secondary to nausea. Pt planning to d/c home with 24/7 supervision from family initially. Pt would benefit from continued skilled OT to maximize independence and safety with tub transfers using a 3 in 1.     Follow Up Recommendations  No OT follow up;Supervision/Assistance - 24 hour    Equipment Recommendations  3 in 1 bedside comode    Recommendations for Other Services       Precautions / Restrictions Precautions Precautions: Knee Precaution Comments: reviewed precautions and use of zero degree knee Restrictions Weight Bearing Restrictions: Yes RLE Weight Bearing: Weight bearing as tolerated      Mobility Bed Mobility Overal bed mobility: Needs Assistance Bed Mobility: Sit to Supine       Sit to supine: Min assist   General bed mobility comments: Min assist for managing RLE into bed. HOB flat and use of rails   Transfers Overall transfer level: Needs assistance Equipment used: Rolling walker (2 wheeled) Transfers: Sit to/from Stand Sit to Stand: Supervision         General transfer comment: Supervision for safety. Good hand placement and technique.    Balance Overall balance assessment: Needs assistance Sitting-balance support: Feet supported Sitting balance-Leahy Scale: Good     Standing balance support: Bilateral upper extremity supported Standing balance-Leahy Scale: Poor Standing balance comment: RW for support                            ADL Overall  ADL's : Needs assistance/impaired Eating/Feeding: Set up;Sitting   Grooming: Supervision/safety;Standing       Lower Body Bathing: Supervison/ safety;Sit to/from stand       Lower Body Dressing: Supervision/safety;Sit to/from stand Lower Body Dressing Details (indicate cue type and reason): Pt able to reach bilateral toes. Pt reports wife can assist with ADLs if needed. Educated on compensatory strategies for LB ADLs. Toilet Transfer: Supervision/safety;Comfort height toilet;Grab bars;RW Statistician Details (indicate cue type and reason): Educated on use of 3 in 1 over toilet Toileting- Architect and Hygiene: Supervision/safety;Sit to/from Nurse, children's Details (indicate cue type and reason): Educated on tub transfer technique with use of 3 in1 as seat and provided pt with handout; pt declined to practice secondary to nausea but verbalized understanding. Recommended pt perform dry run through of tub transfer with home health therapist prior to attempting on own; pt verbalized understanding. Functional mobility during ADLs: Supervision/safety;Rolling walker General ADL Comments: No family present for OT eval. Limited OT eval secondary to nausea without emesis (RN aware). Educated on home safety, need for supervision with ADLs and functional mobility initally, ice for edema and pain; pt verbalized understanding.      Vision     Perception     Praxis      Pertinent Vitals/Pain Pain Assessment: Faces Faces Pain Scale: Hurts even more Pain Location: R thigh +nausea Pain Descriptors / Indicators: Grimacing;Sore Pain Intervention(s): Limited activity within patient's tolerance;Monitored during session;Repositioned;Ice applied;Patient requesting pain meds-RN notified  Hand Dominance Right   Extremity/Trunk Assessment Upper Extremity Assessment Upper Extremity Assessment: Overall WFL for tasks assessed   Lower Extremity Assessment Lower Extremity  Assessment: Defer to PT evaluation   Cervical / Trunk Assessment Cervical / Trunk Assessment: Normal   Communication Communication Communication: No difficulties   Cognition Arousal/Alertness: Awake/alert Behavior During Therapy: WFL for tasks assessed/performed Overall Cognitive Status: Within Functional Limits for tasks assessed                     General Comments       Exercises       Shoulder Instructions      Home Living Family/patient expects to be discharged to:: Private residence Living Arrangements: Spouse/significant other;Children Available Help at Discharge: Family;Available 24 hours/day (initially) Type of Home: House Home Access: Stairs to enter Entergy CorporationEntrance Stairs-Number of Steps: 1 Entrance Stairs-Rails: None Home Layout: One level     Bathroom Shower/Tub: Chief Strategy OfficerTub/shower unit   Bathroom Toilet: Standard Bathroom Accessibility: Yes How Accessible: Accessible via walker Home Equipment: Cane - single point          Prior Functioning/Environment Level of Independence: Independent             OT Diagnosis: Acute pain   OT Problem List: Decreased activity tolerance;Impaired balance (sitting and/or standing);Decreased knowledge of use of DME or AE;Decreased knowledge of precautions;Pain;Obesity   OT Treatment/Interventions: Self-care/ADL training;DME and/or AE instruction;Energy conservation;Patient/family education    OT Goals(Current goals can be found in the care plan section) Acute Rehab OT Goals Patient Stated Goal: not have nausea OT Goal Formulation: With patient Time For Goal Achievement: 05/01/15 Potential to Achieve Goals: Good ADL Goals Pt Will Perform Tub/Shower Transfer: Tub transfer;with supervision;3 in 1;rolling walker  OT Frequency: Min 2X/week   Barriers to D/C:            Co-evaluation              End of Session Equipment Utilized During Treatment: Gait belt;Rolling walker CPM Right Knee CPM Right Knee:  Off Nurse Communication: Patient requests pain meds;Other (comment) (Pt with nausea, IV beeping)  Activity Tolerance: Other (comment) (Pt limited by nausea ) Patient left: in bed;with call bell/phone within reach;Other (comment) (zero degree bone foam applied to RLE)   Time: [id number] OT Time Calculation (min): 14 min Charges:  OT General Charges $OT Visit: 1 Procedure OT Evaluation $OT Eval Low Complexity: 1 Procedure G-Codes:     Gaye AlkenBailey A Berel Najjar M.S., OTR/L Pager: [phone]   04/17/2015, 9:14 AM

## 2015-04-17 NOTE — Discharge Instructions (Signed)
INSTRUCTIONS AFTER JOINT REPLACEMENT  ° °o Remove items at home which could result in a fall. This includes throw rugs or furniture in walking pathways °o ICE to the affected joint every three hours while awake for 30 minutes at a time, for at least the first 3-5 days, and then as needed for pain and swelling.  Continue to use ice for pain and swelling. You may notice swelling that will progress down to the foot and ankle.  This is normal after surgery.  Elevate your leg when you are not up walking on it.   °o Continue to use the breathing machine you got in the hospital (incentive spirometer) which will help keep your temperature down.  It is common for your temperature to cycle up and down following surgery, especially at night when you are not up moving around and exerting yourself.  The breathing machine keeps your lungs expanded and your temperature down. ° ° °DIET:  As you were doing prior to hospitalization, we recommend a well-balanced diet. ° °DRESSING / WOUND CARE / SHOWERING ° °You may change your dressing 3-5 days after surgery.  Then change the dressing every day with sterile gauze.  Please use good hand washing techniques before changing the dressing.  Do not use any lotions or creams on the incision until instructed by your surgeon. and You may shower 3 days after surgery, but keep the wounds dry during showering.  You may use an occlusive plastic wrap (Press'n Seal for example), NO SOAKING/SUBMERGING IN THE BATHTUB.  If the bandage gets wet, change with a clean dry gauze.  If the incision gets wet, pat the wound dry with a clean towel. ° °ACTIVITY ° °o Increase activity slowly as tolerated, but follow the weight bearing instructions below.   °o No driving for 6 weeks or until further direction given by your physician.  You cannot drive while taking narcotics.  °o No lifting or carrying greater than 10 lbs. until further directed by your surgeon. °o Avoid periods of inactivity such as sitting longer  than an hour when not asleep. This helps prevent blood clots.  °o You may return to work once you are authorized by your doctor.  ° ° ° °WEIGHT BEARING  ° °Weight bearing as tolerated with assist device (walker, cane, etc) as directed, use it as long as suggested by your surgeon or therapist, typically at least 4-6 weeks. ° ° °EXERCISES ° °Results after joint replacement surgery are often greatly improved when you follow the exercise, range of motion and muscle strengthening exercises prescribed by your doctor. Safety measures are also important to protect the joint from further injury. Any time any of these exercises cause you to have increased pain or swelling, decrease what you are doing until you are comfortable again and then slowly increase them. If you have problems or questions, call your caregiver or physical therapist for advice.  ° °Rehabilitation is important following a joint replacement. After just a few days of immobilization, the muscles of the leg can become weakened and shrink (atrophy).  These exercises are designed to build up the tone and strength of the thigh and leg muscles and to improve motion. Often times heat used for twenty to thirty minutes before working out will loosen up your tissues and help with improving the range of motion but do not use heat for the first two weeks following surgery (sometimes heat can increase post-operative swelling).  ° °These exercises can be done on a training (  exercise) mat, on the floor, on a table or on a bed. Use whatever works the best and is most comfortable for you.    Use music or television while you are exercising so that the exercises are a pleasant break in your day. This will make your life better with the exercises acting as a break in your routine that you can look forward to.   Perform all exercises about fifteen times, three times per day or as directed.  You should exercise both the operative leg and the other leg as well. ° °Exercises  include: °  °• Quad Sets - Tighten up the muscle on the front of the thigh (Quad) and hold for 5-10 seconds.   °• Straight Leg Raises - With your knee straight (if you were given a brace, keep it on), lift the leg to 60 degrees, hold for 3 seconds, and slowly lower the leg.  Perform this exercise against resistance later as your leg gets stronger.  °• Leg Slides: Lying on your back, slowly slide your foot toward your buttocks, bending your knee up off the floor (only go as far as is comfortable). Then slowly slide your foot back down until your leg is flat on the floor again.  °• Angel Wings: Lying on your back spread your legs to the side as far apart as you can without causing discomfort.  °• Hamstring Strength:  Lying on your back, push your heel against the floor with your leg straight by tightening up the muscles of your buttocks.  Repeat, but this time bend your knee to a comfortable angle, and push your heel against the floor.  You may put a pillow under the heel to make it more comfortable if necessary.  ° °A rehabilitation program following joint replacement surgery can speed recovery and prevent re-injury in the future due to weakened muscles. Contact your doctor or a physical therapist for more information on knee rehabilitation.  ° ° °CONSTIPATION ° °Constipation is defined medically as fewer than three stools per week and severe constipation as less than one stool per week.  Even if you have a regular bowel pattern at home, your normal regimen is likely to be disrupted due to multiple reasons following surgery.  Combination of anesthesia, postoperative narcotics, change in appetite and fluid intake all can affect your bowels.  ° °YOU MUST use at least one of the following options; they are listed in order of increasing strength to get the job done.  They are all available over the counter, and you may need to use some, POSSIBLY even all of these options:   ° °Drink plenty of fluids (prune juice may be  helpful) and high fiber foods °Colace 100 mg by mouth twice a day  °Senokot for constipation as directed and as needed Dulcolax (bisacodyl), take with full glass of water  °Miralax (polyethylene glycol) once or twice a day as needed. ° °If you have tried all these things and are unable to have a bowel movement in the first 3-4 days after surgery call either your surgeon or your primary doctor.   ° °If you experience loose stools or diarrhea, hold the medications until you stool forms back up.  If your symptoms do not get better within 1 week or if they get worse, check with your doctor.  If you experience "the worst abdominal pain ever" or develop nausea or vomiting, please contact the office immediately for further recommendations for treatment. ° ° °ITCHING:  If   you experience itching with your medications, try taking only a single pain pill, or even half a pain pill at a time.  You can also use Benadryl over the counter for itching or also to help with sleep.  ° °TED HOSE STOCKINGS:  Use stockings on both legs until for at least 2 weeks or as directed by physician office. They may be removed at night for sleeping. ° °MEDICATIONS:  See your medication summary on the “After Visit Summary” that nursing will review with you.  You may have some home medications which will be placed on hold until you complete the course of blood thinner medication.  It is important for you to complete the blood thinner medication as prescribed. ° °PRECAUTIONS:  If you experience chest pain or shortness of breath - call 911 immediately for transfer to the hospital emergency department.  ° °If you develop a fever greater that 101 F, purulent drainage from wound, increased redness or drainage from wound, foul odor from the wound/dressing, or calf pain - CONTACT YOUR SURGEON.   °                                                °FOLLOW-UP APPOINTMENTS:  If you do not already have a post-op appointment, please call the office for an  appointment to be seen by your surgeon.  Guidelines for how soon to be seen are listed in your “After Visit Summary”, but are typically between 1-4 weeks after surgery. ° °OTHER INSTRUCTIONS:  ° °Knee Replacement:  Do not place pillow under knee, focus on keeping the knee straight while resting. CPM instructions: 0-90 degrees, 2 hours in the morning, 2 hours in the afternoon, and 2 hours in the evening. Place foam block, curve side up under heel at all times except when in CPM or when walking.  DO NOT modify, tear, cut, or change the foam block in any way. ° °MAKE SURE YOU:  °• Understand these instructions.  °• Get help right away if you are not doing well or get worse.  ° ° °Thank you for letting us be a part of your medical care team.  It is a privilege we respect greatly.  We hope these instructions will help you stay on track for a fast and full recovery!  ° °Information on my medicine - ELIQUIS® (apixaban) ° °This medication education was reviewed with me or my healthcare representative as part of my discharge preparation.  The pharmacist that spoke with me during my hospital stay was:  Neylan Koroma T, RPH ° °Why was Eliquis® prescribed for you? °Eliquis® was prescribed for you to reduce the risk of blood clots forming after orthopedic surgery.   ° °What do You need to know about Eliquis®? °Take your Eliquis® TWICE DAILY - one tablet in the morning and one tablet in the evening with or without food.  It would be best to take the dose about the same time each day. ° °If you have difficulty swallowing the tablet whole please discuss with your pharmacist how to take the medication safely. ° °Take Eliquis® exactly as prescribed by your doctor and DO NOT stop taking Eliquis® without talking to the doctor who prescribed the medication.  Stopping without other medication to take the place of Eliquis® may increase your risk of developing a clot. ° °After discharge, you should   have regular check-up appointments with  your healthcare provider that is prescribing your Eliquis®. ° °What do you do if you miss a dose? °If a dose of ELIQUIS® is not taken at the scheduled time, take it as soon as possible on the same day and twice-daily administration should be resumed.  The dose should not be doubled to make up for a missed dose.  Do not take more than one tablet of ELIQUIS at the same time. ° °Important Safety Information °A possible side effect of Eliquis® is bleeding. You should call your healthcare provider right away if you experience any of the following: °? Bleeding from an injury or your nose that does not stop. °? Unusual colored urine (red or dark brown) or unusual colored stools (red or black). °? Unusual bruising for unknown reasons. °? A serious fall or if you hit your head (even if there is no bleeding). ° °Some medicines may interact with Eliquis® and might increase your risk of bleeding or clotting while on Eliquis®. To help avoid this, consult your healthcare provider or pharmacist prior to using any new prescription or non-prescription medications, including herbals, vitamins, non-steroidal anti-inflammatory drugs (NSAIDs) and supplements. ° °This website has more information on Eliquis® (apixaban): http://www.eliquis.com/eliquis/home °

## 2015-04-17 NOTE — Care Management Note (Addendum)
Case Management Note  Patient Details  Name: Duane Barrera MRN: [id number] Date of Birth: 05/14/1970  Subjective/Objective:     45 yr old gentleman s/p right total knee arthroplasty.               Action/Plan: Case manager spoke with patient and his family concerning home health and DME needs. Patient was preoperatively setup with [facility], no changes with that. Patient states CPM, to be delivered to him by [employer]. Case manager will confirm. He will have family support at discharge.   Expected Discharge Date:    04/17/15              Expected Discharge Plan:  Home w Home Health Services  In-House Referral:     Discharge planning Services  CM Consult  Post Acute Care Choice:  Durable Medical Equipment, Home Health Choice offered to:  Patient  DME Arranged:  3-N-1, CPM, Walker rolling DME Agency:  [employer], [facility]  HH Arranged:  PT HH Agency:  [facility]  Status of Service:  Completed, signed off  Medicare Important Message Given:    Date Medicare IM Given:    Medicare IM give by:    Date Additional Medicare IM Given:    Additional Medicare Important Message give by:     If discussed at Long Length of Stay Meetings, dates discussed:    Additional Comments:  Durenda GuthrieBrady, Danali Marinos Naomi, RN 04/17/2015, 10:21 AM

## 2015-04-17 NOTE — Progress Notes (Signed)
Pt being discharged home via wheelchair with family. Pt alert and oriented x4. VSS. Pt c/o no pain at this time. No signs of respiratory distress. Education complete and care plans resolved. IV removed with catheter intact and pt tolerated well. No further issues at this time. Pt to follow up with PCP. Alayssa Flinchum R, RN 

## 2015-04-18 DIAGNOSIS — Z96651 Presence of right artificial knee joint: Secondary | ICD-10-CM | POA: Diagnosis not present

## 2015-04-18 DIAGNOSIS — Z471 Aftercare following joint replacement surgery: Secondary | ICD-10-CM | POA: Diagnosis not present

## 2015-04-21 DIAGNOSIS — Z471 Aftercare following joint replacement surgery: Secondary | ICD-10-CM | POA: Diagnosis not present

## 2015-04-21 DIAGNOSIS — Z96651 Presence of right artificial knee joint: Secondary | ICD-10-CM | POA: Diagnosis not present

## 2015-04-22 DIAGNOSIS — Z471 Aftercare following joint replacement surgery: Secondary | ICD-10-CM | POA: Diagnosis not present

## 2015-04-22 DIAGNOSIS — Z96651 Presence of right artificial knee joint: Secondary | ICD-10-CM | POA: Diagnosis not present

## 2015-04-25 DIAGNOSIS — Z96651 Presence of right artificial knee joint: Secondary | ICD-10-CM | POA: Diagnosis not present

## 2015-04-25 DIAGNOSIS — Z471 Aftercare following joint replacement surgery: Secondary | ICD-10-CM | POA: Diagnosis not present

## 2015-04-28 DIAGNOSIS — Z96651 Presence of right artificial knee joint: Secondary | ICD-10-CM | POA: Diagnosis not present

## 2015-04-28 DIAGNOSIS — Z471 Aftercare following joint replacement surgery: Secondary | ICD-10-CM | POA: Diagnosis not present

## 2015-04-29 DIAGNOSIS — Z96651 Presence of right artificial knee joint: Secondary | ICD-10-CM | POA: Diagnosis not present

## 2015-05-01 DIAGNOSIS — Z96651 Presence of right artificial knee joint: Secondary | ICD-10-CM | POA: Diagnosis not present

## 2015-05-01 DIAGNOSIS — Z471 Aftercare following joint replacement surgery: Secondary | ICD-10-CM | POA: Diagnosis not present

## 2015-05-08 ENCOUNTER — Ambulatory Visit: Payer: 59 | Attending: Orthopedic Surgery | Admitting: Physical Therapy

## 2015-05-08 DIAGNOSIS — M25561 Pain in right knee: Secondary | ICD-10-CM | POA: Insufficient documentation

## 2015-05-08 DIAGNOSIS — M25461 Effusion, right knee: Secondary | ICD-10-CM | POA: Diagnosis not present

## 2015-05-08 NOTE — Therapy (Signed)
[facility] [address] [place], [place], [address] Phone: [phone]   Fax:  [phone]  Physical Therapy Evaluation  Patient Details  Name: Duane Barrera MRN: [id number] Date of Birth: 26-Oct-1970 Referring Provider: Mckinley Jewel MD  Encounter Date: 05/08/2015      PT End of Session - 05/08/15 1211    Visit Number 1   Number of Visits 12   Date for PT Re-Evaluation 06/26/15   PT Start Time 0900   PT Stop Time 0951   PT Time Calculation (min) 51 min   Activity Tolerance Patient tolerated treatment well   Behavior During Therapy [facility] for tasks assessed/performed      Past Medical History  Diagnosis Date  . Complication of anesthesia   . PONV (postoperative nausea and vomiting)   . GERD (gastroesophageal reflux disease)     otc tums  . Chronic kidney disease     h/o kidneys 2003-04  . History of hiatal hernia   . Headache     h/o migraines  . Arthritis     both knees and elbow    Past Surgical History  Procedure Laterality Date  . Cholecystectomy    . Shoulder arthroscopy      removed distal end and cleaned out "junk" per Dr. Eulah Pont  . Elbow arthroscopy      4 pins in elbow  . Wisdom tooth extraction    . Meniscus repair      right leg  . Total knee arthroplasty Right 04/16/2015    Procedure: RIGHT TOTAL KNEE ARTHROPLASTY;  Surgeon: Loreta Ave, MD;  Location: [facility] OR;  Service: Orthopedics;  Laterality: Right;    There were no vitals filed for this visit.  Visit Diagnosis:  Right knee pain - Plan: PT plan of care cert/re-cert  Swelling of right knee joint - Plan: PT plan of care cert/re-cert      Subjective Assessment - 05/08/15 0929    Subjective Having no pain at rest.   Patient Stated Goals Get back to work.   Currently in Pain? No/denies            [facility] PT Assessment - 05/08/15 0001    Assessment   Medical Diagnosis Right total knee replacement.   Referring Provider Mckinley Jewel MD   Onset Date/Surgical Date --  04/16/15(surgery date).   Next MD Visit --  05/27/15.   Precautions   Precautions --  No ultrasound.   Restrictions   Weight Bearing Restrictions No   Balance Screen   Has the patient fallen in the past 6 months No   Has the patient had a decrease in activity level because of a fear of falling?  No   Is the patient reluctant to leave their home because of a fear of falling?  No   Home Environment   Living Environment Private residence   Prior Function   Level of Independence Independent   Observation/Other Assessments   Observations Right knee incision looks very good.   Observation/Other Assessments-Edema    Edema Circumferential   Circumferential Edema   Circumferential - Right 8 cms > thsn left.   ROM / Strength   AROM / PROM / Strength AROM;Strength   AROM   Overall AROM Comments Full active right knee extension and active flexion to 110 degrees.   Strength   Overall Strength Comments Right hip and knee strength= 4+/5.   Palpation   Palpation comment Mild right knee medial tenderness.   Ambulation/Gait  Gait Comments Patient ambulating safely without a difficulty.                                PT Long Term Goals - 05/08/15 0981    PT LONG TERM GOAL #1   Title Ind with a HEP.   Time 8   Period Weeks   Status New   PT LONG TERM GOAL #2   Title Active right knee flexion to 120 degrees.   Time 8   Period Weeks   Status New   PT LONG TERM GOAL #3   Title Increase knee strength to a solid 5/5 to provide good stability for accomplishment of functional activities   Time 8   Period Weeks   PT LONG TERM GOAL #4   Title Decrease edema to within 3 cms of non-affected side to assist with pain reduction and range of motion gains.   Time 8   Period Weeks   Status New               Plan - 05/08/15 0930    Clinical Impression Statement The patient reports that last April (2016) he felt sudden pain in his right  knee.  Injections were of little help.  her underwent a right total knee replacement on 04/16/15 and the had HH PT.  He reports no pain at rest and is not using an assistive device.   Pt will benefit from skilled therapeutic intervention in order to improve on the following deficits Pain;Decreased activity tolerance;Decreased range of motion   Rehab Potential Excellent   PT Frequency 2x / week   PT Duration 8 weeks   PT Treatment/Interventions ADLs/Self Care Home Management;Electrical Stimulation;Therapeutic activities;Therapeutic exercise;Manual techniques;Patient/family education;Vasopneumatic Device;Passive range of motion   PT Next Visit Plan Nustep.  Right LE strengthening.  Vasopneumatic and electrical stimulation PRN.   Consulted and Agree with Plan of Care Patient         Problem List Patient Active Problem List   Diagnosis Date Noted  . DJD (degenerative joint disease) of knee 04/16/2015    Duane Barrera, [name] MPT 05/08/2015, 12:17 PM  [facility] [address] [place], [place], [address] Phone: [phone]   Fax:  [phone]  Name: Duane Barrera MRN: [id number] Date of Birth: 08/01/1970

## 2015-05-12 ENCOUNTER — Ambulatory Visit: Payer: 59 | Admitting: Physical Therapy

## 2015-05-12 ENCOUNTER — Encounter: Payer: Self-pay | Admitting: Physical Therapy

## 2015-05-12 DIAGNOSIS — M25561 Pain in right knee: Secondary | ICD-10-CM

## 2015-05-12 DIAGNOSIS — M25461 Effusion, right knee: Secondary | ICD-10-CM

## 2015-05-12 NOTE — Patient Instructions (Signed)
Strengthening: Hip Abduction (Side-Lying)  Straight Leg Raise   Tighten stomach and slowly raise locked right leg __4__ inches from floor. Repeat __10-30__ times per set. Do __2__ sets per session. Do __2__ sessions per day.        Knee Flexion Stretch on Step  Place foot on step and lean forward until you feel a good stretch in front of knee.   hold 30 sec x 5-10 perform 2-4 x daily

## 2015-05-12 NOTE — Therapy (Signed)
[facility] [address] [place], [place], [address] Phone: [phone]   Fax:  [phone]  Physical Therapy Treatment  Patient Details  Name: Duane Barrera MRN: [id number] Date of Birth: 12-26-1970 Referring Provider: Mckinley Jewel MD  Encounter Date: 05/12/2015      PT End of Session - 05/12/15 0810    Visit Number 2   Number of Visits 12   Date for PT Re-Evaluation 06/26/15   PT Start Time 0728   PT Stop Time 0826   PT Time Calculation (min) 58 min   Activity Tolerance Patient tolerated treatment well   Behavior During Therapy [facility] for tasks assessed/performed      Past Medical History  Diagnosis Date  . Complication of anesthesia   . PONV (postoperative nausea and vomiting)   . GERD (gastroesophageal reflux disease)     otc tums  . Chronic kidney disease     h/o kidneys 2003-04  . History of hiatal hernia   . Headache     h/o migraines  . Arthritis     both knees and elbow    Past Surgical History  Procedure Laterality Date  . Cholecystectomy    . Shoulder arthroscopy      removed distal end and cleaned out "junk" per Dr. Eulah Pont  . Elbow arthroscopy      4 pins in elbow  . Wisdom tooth extraction    . Meniscus repair      right leg  . Total knee arthroplasty Right 04/16/2015    Procedure: RIGHT TOTAL KNEE ARTHROPLASTY;  Surgeon: Loreta Ave, MD;  Location: [facility] OR;  Service: Orthopedics;  Laterality: Right;    There were no vitals filed for this visit.  Visit Diagnosis:  Right knee pain  Swelling of right knee joint      Subjective Assessment - 05/12/15 0731    Subjective no pain complaints just tightness   Patient Stated Goals Get back to work.   Currently in Pain? No/denies            [facility] PT Assessment - 05/12/15 0001    ROM / Strength   AROM / PROM / Strength AROM;PROM   AROM   Overall AROM  Deficits   AROM Assessment Site Knee   Right/Left Knee Right   Right Knee Flexion 110   PROM    Overall PROM  Deficits   PROM Assessment Site Knee   Right/Left Knee Right   Right Knee Flexion 118                     [facility] Adult PT Treatment/Exercise - 05/12/15 0001    Exercises   Exercises Knee/Hip   Knee/Hip Exercises: Aerobic   Stationary Bike x66min L1   Knee/Hip Exercises: Standing   Terminal Knee Extension Strengthening;Right;3 sets;10 reps   Theraband Level (Terminal Knee Extension) --  Pink XTS   Lateral Step Up Right;3 sets;10 reps;Step Height: 6"   Forward Step Up Right;3 sets;10 reps;Step Height: 6"   Knee/Hip Exercises: Supine   Straight Leg Raise with External Rotation Limitations 3x10   Knee/Hip Exercises: Sidelying   Hip ABduction AROM;Right;2 sets;10 reps   Modalities   Modalities Vasopneumatic   Vasopneumatic   Number Minutes Vasopneumatic  15 minutes   Vasopnuematic Location  Knee   Vasopneumatic Pressure Medium   Manual Therapy   Manual Therapy Passive ROM   Passive ROM PROM for right knee flexion with low load holds  PT Education - 05/12/15 0809    Education provided Yes   Education Details HEP   Person(s) Educated Patient   Methods Explanation;Demonstration;Handout   Comprehension Verbalized understanding;Returned demonstration             PT Long Term Goals - 05/08/15 0938    PT LONG TERM GOAL #1   Title Ind with a HEP.   Time 8   Period Weeks   Status New   PT LONG TERM GOAL #2   Title Active right knee flexion to 120 degrees.   Time 8   Period Weeks   Status New   PT LONG TERM GOAL #3   Title Increase knee strength to a solid 5/5 to provide good stability for accomplishment of functional activities   Time 8   Period Weeks   PT LONG TERM GOAL #4   Title Decrease edema to within 3 cms of non-affected side to assist with pain reduction and range of motion gains.   Time 8   Period Weeks   Status New               Plan - 05/12/15 1610    Clinical Impression Statement Patient  tolerated treatment very well with no reports of pain increase. HEP given for self flexion stretching and strengthening. Patient has improved knee flexion today yet unable to meet any further goals due to pain, strength and full flexion ROM deficits   Pt will benefit from skilled therapeutic intervention in order to improve on the following deficits Pain;Decreased activity tolerance;Decreased range of motion   Rehab Potential Excellent   PT Frequency 2x / week   PT Duration 8 weeks   PT Treatment/Interventions ADLs/Self Care Home Management;Electrical Stimulation;Therapeutic activities;Therapeutic exercise;Manual techniques;Patient/family education;Vasopneumatic Device;Passive range of motion   PT Next Visit Plan Nustep.  Right LE strengthening.  Vasopneumatic and electrical stimulation PRN. (MD Eulah Pont 05/27/15)   Consulted and Agree with Plan of Care Patient        Problem List Patient Active Problem List   Diagnosis Date Noted  . DJD (degenerative joint disease) of knee 04/16/2015    Dempsey Knotek P, PTA 05/12/2015, 8:26 AM  [facility] [address] [place], [place], [address] Phone: [phone]   Fax:  [phone]  Name: Duane Barrera MRN: [id number] Date of Birth: 10/13/1970

## 2015-05-15 ENCOUNTER — Ambulatory Visit: Payer: 59 | Attending: Orthopedic Surgery | Admitting: Physical Therapy

## 2015-05-15 ENCOUNTER — Encounter: Payer: Self-pay | Admitting: Physical Therapy

## 2015-05-15 DIAGNOSIS — M25561 Pain in right knee: Secondary | ICD-10-CM | POA: Diagnosis not present

## 2015-05-15 DIAGNOSIS — M25461 Effusion, right knee: Secondary | ICD-10-CM | POA: Diagnosis not present

## 2015-05-15 NOTE — Therapy (Signed)
[facility] [address] [place], [place], [address] Phone: [phone]   Fax:  [phone]  Physical Therapy Treatment  Patient Details  Name: Duane Barrera MRN: [id number] Date of Birth: [birth year] Referring Provider: Mckinley Jewel MD  Encounter Date: 05/15/2015      PT End of Session - 05/15/15 0734    Visit Number 3   Number of Visits 12   Date for PT Re-Evaluation 06/26/15   PT Start Time 0732   PT Stop Time 0818   PT Time Calculation (min) 46 min   Activity Tolerance Patient tolerated treatment well   Behavior During Therapy [facility] for tasks assessed/performed      Past Medical History  Diagnosis Date  . Complication of anesthesia   . PONV (postoperative nausea and vomiting)   . GERD (gastroesophageal reflux disease)     otc tums  . Chronic kidney disease     h/o kidneys 2003-04  . History of hiatal hernia   . Headache     h/o migraines  . Arthritis     both knees and elbow    Past Surgical History  Procedure Laterality Date  . Cholecystectomy    . Shoulder arthroscopy      removed distal end and cleaned out "junk" per Dr. Eulah Pont  . Elbow arthroscopy      4 pins in elbow  . Wisdom tooth extraction    . Meniscus repair      right leg  . 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  Visit Diagnosis:  Right knee pain  Swelling of right knee joint      Subjective Assessment - 05/15/15 0733    Subjective Reports that knee is a little sore today.   Patient Stated Goals Get back to work.   Currently in Pain? Yes   Pain Score 1    Pain Location Knee   Pain Orientation Right   Pain Descriptors / Indicators Sore            [facility] PT Assessment - 05/15/15 0001    Assessment   Medical Diagnosis Right total knee replacement.   Onset Date/Surgical Date 04/16/15   Next MD Visit 05/27/2015                     [facility] Adult PT Treatment/Exercise - 05/15/15 0001    Knee/Hip Exercises: Aerobic   Stationary Bike L0 x12 min   Knee/Hip Exercises: Standing   Forward Lunges Right;2 sets;10 reps;3 seconds;Other (comment)  14" step   Lateral Step Up Right;3 sets;10 reps;Step Height: 6"   Forward Step Up Right;3 sets;10 reps;Step Height: 6"   Rocker Board 3 minutes   Knee/Hip Exercises: Seated   Long Arc Quad Strengthening;Right;3 sets;10 reps;Weights  3 sec end range hold   Long [employer] Weight 4 lbs.   Knee/Hip Exercises: Supine   Straight Leg Raise with External Rotation Strengthening;Right;3 sets;10 reps   Knee/Hip Exercises: Sidelying   Hip ABduction Strengthening;Right;3 sets;10 reps   Modalities   Modalities Vasopneumatic   Vasopneumatic   Number Minutes Vasopneumatic  15 minutes   Vasopnuematic Location  Knee   Vasopneumatic Pressure Medium   Vasopneumatic Temperature  64  PT Long Term Goals - 05/15/15 1610    PT LONG TERM GOAL #1   Title Ind with a HEP.   Time 8   Period Weeks   Status On-going   PT LONG TERM GOAL #2   Title Active right knee flexion to 120 degrees.   Time 8   Period Weeks   Status On-going   PT LONG TERM GOAL #3   Title Increase knee strength to a solid 5/5 to provide good stability for accomplishment of functional activities   Time 8   Period Weeks   Status On-going   PT LONG TERM GOAL #4   Title Decrease edema to within 3 cms of non-affected side to assist with pain reduction and range of motion gains.   Time 8   Period Weeks   Status On-going               Plan - 05/15/15 9604    Clinical Impression Statement Patient tolerated today's treatment well with no reports of increased R knee pain. Patient has no reports of difficulty at home or with any activities. Completed all exercises well with only minimal mulitmodal  cueing for proper exercise technique. Ambulates well without an AD around clinic with no significant gait deviation. Demonstrated no compensatory strategies of the R knee or hip with step activities. Normal vasopneumatic response noted following removal of the vasopnuematic system.  Patient denied pain following today's treatment.   Pt will benefit from skilled therapeutic intervention in order to improve on the following deficits Pain;Decreased activity tolerance;Decreased range of motion   Rehab Potential Excellent   PT Frequency 2x / week   PT Duration 8 weeks   PT Treatment/Interventions ADLs/Self Care Home Management;Electrical Stimulation;Therapeutic activities;Therapeutic exercise;Manual techniques;Patient/family education;Vasopneumatic Device;Passive range of motion   PT Next Visit Plan Continue per MPT POC and TKR protocol with emphasis on R knee flexion and strengthening. Consider intiating 8" step activities and knee machine strengthening next treatment.   Consulted and Agree with Plan of Care Patient        Problem List Patient Active Problem List   Diagnosis Date Noted  . DJD (degenerative joint disease) of knee 04/16/2015    Evelene Croon, PTA 05/15/2015, 8:20 AM  [facility] [address] [place], [place], [address] Phone: [phone]   Fax:  [phone]  Name: Duane Barrera MRN: [id number] Date of Birth: 30-Oct-1970

## 2015-05-19 ENCOUNTER — Encounter: Payer: Self-pay | Admitting: Physical Therapy

## 2015-05-19 ENCOUNTER — Ambulatory Visit: Payer: 59 | Admitting: Physical Therapy

## 2015-05-19 DIAGNOSIS — M25561 Pain in right knee: Secondary | ICD-10-CM | POA: Diagnosis not present

## 2015-05-19 DIAGNOSIS — M25461 Effusion, right knee: Secondary | ICD-10-CM

## 2015-05-19 NOTE — Therapy (Signed)
[facility] [address] [place], [place], [address] Phone: [phone]   Fax:  [phone]  Physical Therapy Treatment  Patient Details  Name: Duane Barrera MRN: [id number] Date of Birth: 07-Jun-1970 Referring Provider: Mckinley Jewel MD  Encounter Date: 05/19/2015      PT End of Session - 05/19/15 0734    Visit Number 4   Number of Visits 12   Date for PT Re-Evaluation 06/26/15   PT Start Time 0734   PT Stop Time 0817   PT Time Calculation (min) 43 min   Activity Tolerance Patient tolerated treatment well   Behavior During Therapy [facility] for tasks assessed/performed      Past Medical History  Diagnosis Date  . Complication of anesthesia   . PONV (postoperative nausea and vomiting)   . GERD (gastroesophageal reflux disease)     otc tums  . Chronic kidney disease     h/o kidneys 2003-04  . History of hiatal hernia   . Headache     h/o migraines  . Arthritis     both knees and elbow    Past Surgical History  Procedure Laterality Date  . Cholecystectomy    . Shoulder arthroscopy      removed distal end and cleaned out "junk" per Dr. Eulah Pont  . Elbow arthroscopy      4 pins in elbow  . Wisdom tooth extraction    . Meniscus repair      right leg  . 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  Visit Diagnosis:  Right knee pain  Swelling of right knee joint      Subjective Assessment - 05/19/15 0734    Subjective Reports he had a rough weekend and his knee is a little sore.   Patient Stated Goals Get back to work.   Currently in Pain? Yes   Pain Score 3    Pain Location Knee   Pain Orientation Right   Pain Descriptors / Indicators Sore            [facility] PT Assessment - 05/19/15 0001    Assessment   Medical Diagnosis Right total knee  replacement.   Onset Date/Surgical Date 04/16/15   Next MD Visit 05/27/2015   Observation/Other Assessments-Edema    Edema Circumferential   Circumferential Edema   Circumferential - Right 53.9   Circumferential - Left  48.0   ROM / Strength   AROM / PROM / Strength AROM   AROM   Right Knee Flexion 125                     [facility] Adult PT Treatment/Exercise - 05/19/15 0001    Knee/Hip Exercises: Aerobic   Stationary Bike L0 x12 min   Knee/Hip Exercises: Standing   Forward Lunges Right;2 sets;10 reps;3 seconds;Other (comment)  14" step   Lateral Step Up Right;3 sets;10 reps;Hand Hold: 2;Step Height: 8"   Forward Step Up Right;3 sets;10 reps;Hand Hold: 2;Step Height: 8"   Rocker Board 3 minutes   Knee/Hip Exercises: Seated   Long Arc Quad Strengthening;Right;3 sets;10 reps;Weights   Long Arc Quad Weight 4 lbs.   Knee/Hip Exercises: Sidelying   Hip ABduction --   Modalities   Modalities Vasopneumatic   Vasopneumatic   Number Minutes Vasopneumatic  15  minutes   Vasopnuematic Location  Knee   Vasopneumatic Pressure Medium   Vasopneumatic Temperature  34                     PT Long Term Goals - 05/19/15 0803    PT LONG TERM GOAL #1   Title Ind with a HEP.   Time 8   Period Weeks   Status Achieved   PT LONG TERM GOAL #2   Title Active right knee flexion to 120 degrees.   Time 8   Period Weeks   Status Achieved  AROM R knee flexion 125 deg 05/19/2015   PT LONG TERM GOAL #3   Title Increase knee strength to a solid 5/5 to provide good stability for accomplishment of functional activities   Time 8   Period Weeks   Status On-going   PT LONG TERM GOAL #4   Title Decrease edema to within 3 cms of non-affected side to assist with pain reduction and range of motion gains.   Time 8   Period Weeks   Status On-going  R knee midpatellar circumference 5.9 cm greater than L knee.               Plan - 05/19/15 0804    Clinical Impression  Statement Patient tolerated today's treatment well with no reports of increased pain or soreness by patient. Completes all exercises well with very minimal multimodal cueing for proper exercise technique. AROM R knee flexion measured as 125 deg in supine. R knee midpatellar edema was 5.9 cm greater than L knee today. Patient was educated that lack of sensation is a common complaint following TKR. Tolerated 8" step activities well without negetive complaints. Normal vasopneumatic response noted following removal of the modality. Achieved LT goal regarding R knee flexion ROM and HEP today and all other goals on-going secondary to lack of R knee strength and increased R knee edema.   Pt will benefit from skilled therapeutic intervention in order to improve on the following deficits Pain;Decreased activity tolerance;Decreased range of motion   Rehab Potential Excellent   PT Frequency 2x / week   PT Duration 8 weeks   PT Treatment/Interventions ADLs/Self Care Home Management;Electrical Stimulation;Therapeutic activities;Therapeutic exercise;Manual techniques;Patient/family education;Vasopneumatic Device;Passive range of motion   PT Next Visit Plan Continue per MPT POC and TKR protocol with emphasis on R knee strengthening. Consider intiating knee machine strengthening next treatment.   Consulted and Agree with Plan of Care Patient        Problem List Patient Active Problem List   Diagnosis Date Noted  . DJD (degenerative joint disease) of knee 04/16/2015    Evelene Croon, PTA 05/19/2015, 8:19 AM  [facility] [address] [place], [place], [address] Phone: [phone]   Fax:  [phone]  Name: DELSON DULWORTH MRN: [id number] Date of Birth: Mar 28, 1971

## 2015-05-22 ENCOUNTER — Ambulatory Visit: Payer: 59 | Admitting: Physical Therapy

## 2015-05-22 ENCOUNTER — Encounter: Payer: Self-pay | Admitting: Physical Therapy

## 2015-05-22 DIAGNOSIS — M25561 Pain in right knee: Secondary | ICD-10-CM

## 2015-05-22 DIAGNOSIS — M25461 Effusion, right knee: Secondary | ICD-10-CM

## 2015-05-22 NOTE — Therapy (Signed)
[facility] [place], [place], [address] Phone: [phone]   Fax:  [phone]  Physical Therapy Treatment  Patient Details  Name: Duane Barrera MRN: [id number] Date of Birth: 02/11/1971 Referring Provider: Kathryne Hitch MD  Encounter Date: 05/22/2015      PT End of Session - 05/22/15 0732    Visit Number 5   Number of Visits 12   Date for PT Re-Evaluation 06/26/15   PT Start Time 0731   PT Stop Time 0819   PT Time Calculation (min) 48 min   Activity Tolerance Patient tolerated treatment well   Behavior During Therapy [facility] for tasks assessed/performed      Past Medical History  Diagnosis Date  . Complication of anesthesia   . PONV (postoperative nausea and vomiting)   . GERD (gastroesophageal reflux disease)     otc tums  . Chronic kidney disease     h/o kidneys 2003-04  . History of hiatal hernia   . Headache     h/o migraines  . Arthritis     both knees and elbow    Past Surgical History  Procedure Laterality Date  . Cholecystectomy    . Shoulder arthroscopy      removed distal end and cleaned out "junk" per Dr. Percell Miller  . Elbow arthroscopy      4 pins in elbow  . Wisdom tooth extraction    . Meniscus repair      right leg  . Total knee arthroplasty Right 04/16/2015    Procedure: RIGHT TOTAL KNEE ARTHROPLASTY;  Surgeon: Ninetta Lights, MD;  Location: [facility];  Service: Orthopedics;  Laterality: Right;    There were no vitals filed for this visit.  Visit Diagnosis:  Right knee pain  Swelling of right knee joint      Subjective Assessment - 05/22/15 0731    Subjective Reports no new concerns today.   Patient Stated Goals Get back to work.   Currently in Pain? No/denies            [facility] PT Assessment - 05/22/15 0001    Assessment   Medical Diagnosis Right total knee replacement.   Onset Date/Surgical Date 04/16/15   Next MD Visit 05/27/2015   Observation/Other Assessments-Edema    Edema  Circumferential   Circumferential Edema   Circumferential - Right 54.2   Circumferential - Left  48.0   ROM / Strength   AROM / PROM / Strength Strength   Strength   Overall Strength Within functional limits for tasks performed   Strength Assessment Site Hip;Knee   Right/Left Hip Right   Right Hip Flexion 5/5   Right Hip ABduction 5/5   Right/Left Knee Right   Right Knee Flexion 5/5   Right Knee Extension 5/5                     [facility] Adult PT Treatment/Exercise - 05/22/15 0001    Knee/Hip Exercises: Aerobic   Stationary Bike L0 x12 min, seat 4   Knee/Hip Exercises: Machines for Strengthening   Cybex Knee Extension 10# 3x10 reps   Cybex Knee Flexion 40# 3x10 reps   Cybex Leg Press 2.5 pl, seat 7 3x10 reps   Knee/Hip Exercises: Standing   Lateral Step Up Right;3 sets;10 reps;Hand Hold: 2;Step Height: 8"   Forward Step Up Right;3 sets;10 reps;Hand Hold: 2;Step Height: 8"   Knee/Hip Exercises: Supine   Straight Leg Raises Strengthening;Right;3 sets;10 reps   Knee/Hip Exercises:  Sidelying   Hip ABduction Strengthening;Right;3 sets;10 reps   Modalities   Modalities Vasopneumatic   Vasopneumatic   Number Minutes Vasopneumatic  15 minutes   Vasopnuematic Location  Knee   Vasopneumatic Pressure Medium   Vasopneumatic Temperature  34                     PT Long Term Goals - 05/22/15 0819    PT LONG TERM GOAL #1   Title Ind with a HEP.   Time 8   Period Weeks   Status Achieved   PT LONG TERM GOAL #2   Title Active right knee flexion to 120 degrees.   Time 8   Period Weeks   Status Achieved  AROM R knee flexion 125 deg 05/19/2015   PT LONG TERM GOAL #3   Title Increase knee strength to a solid 5/5 to provide good stability for accomplishment of functional activities   Time 8   Period Weeks   Status Achieved  R hip and knee MMT 5/5 in tested muscle groups 05/22/2015   PT LONG TERM GOAL #4   Title Decrease edema to within 3 cms of non-affected side  to assist with pain reduction and range of motion gains.   Time 8   Period Weeks   Status Not Met  R knee midpatellar circumference 6.2 cm greater than L knee. 05/22/2015               Plan - 05/22/15 0806    Clinical Impression Statement Patient tolerated today's treatment well and tolerated machine strengthening well with no reports of pain or discomfort. Patient has achieved all goals set at evaluation at this time except for R knee edema goal. R hip and knee MMT measured 5/5 thorughout tested muscle groups. R knee midpatellar edema 6.2 cm greater than L knee as of today's measurment. No AD has been used for ambulatoin in clinic since evaluation. Normal vasopneumatic response noted following following removal of the modalities which was utilized to reduce R knee infkammation. Patient denied R knee pain following today' s treatment.   Pt will benefit from skilled therapeutic intervention in order to improve on the following deficits Pain;Decreased activity tolerance;Decreased range of motion   Rehab Potential Excellent   PT Frequency 2x / week   PT Duration 8 weeks   PT Treatment/Interventions ADLs/Self Care Home Management;Electrical Stimulation;Therapeutic activities;Therapeutic exercise;Manual techniques;Patient/family education;Vasopneumatic Device;Passive range of motion   PT Next Visit Plan Continue with MPT POC at MD discretion.   Consulted and Agree with Plan of Care Patient        Problem List Patient Active Problem List   Diagnosis Date Noted  . DJD (degenerative joint disease) of knee 04/16/2015    Ahmed Prima, PTA 05/22/2015 8:24 AM  [facility] [place], [place], [address] Phone: [phone]   Fax:  [phone]  Name: Duane Barrera MRN: [id number] Date of Birth: 01-15-1971

## 2015-05-22 NOTE — Therapy (Addendum)
[facility] [place], [place], [address] Phone: [phone]   Fax:  [phone]  Physical Therapy Treatment  Patient Details  Name: Duane Barrera MRN: [id number] Date of Birth: 02/11/1971 Referring Provider: Kathryne Hitch MD  Encounter Date: 05/22/2015      PT End of Session - 05/22/15 0732    Visit Number 5   Number of Visits 12   Date for PT Re-Evaluation 06/26/15   PT Start Time 0731   PT Stop Time 0819   PT Time Calculation (min) 48 min   Activity Tolerance Patient tolerated treatment well   Behavior During Therapy [facility] for tasks assessed/performed      Past Medical History  Diagnosis Date  . Complication of anesthesia   . PONV (postoperative nausea and vomiting)   . GERD (gastroesophageal reflux disease)     otc tums  . Chronic kidney disease     h/o kidneys 2003-04  . History of hiatal hernia   . Headache     h/o migraines  . Arthritis     both knees and elbow    Past Surgical History  Procedure Laterality Date  . Cholecystectomy    . Shoulder arthroscopy      removed distal end and cleaned out "junk" per Dr. Percell Miller  . Elbow arthroscopy      4 pins in elbow  . Wisdom tooth extraction    . Meniscus repair      right leg  . Total knee arthroplasty Right 04/16/2015    Procedure: RIGHT TOTAL KNEE ARTHROPLASTY;  Surgeon: Ninetta Lights, MD;  Location: [facility];  Service: Orthopedics;  Laterality: Right;    There were no vitals filed for this visit.  Visit Diagnosis:  Right knee pain  Swelling of right knee joint      Subjective Assessment - 05/22/15 0731    Subjective Reports no new concerns today.   Patient Stated Goals Get back to work.   Currently in Pain? No/denies            [facility] PT Assessment - 05/22/15 0001    Assessment   Medical Diagnosis Right total knee replacement.   Onset Date/Surgical Date 04/16/15   Next MD Visit 05/27/2015   Observation/Other Assessments-Edema    Edema  Circumferential   Circumferential Edema   Circumferential - Right 54.2   Circumferential - Left  48.0   ROM / Strength   AROM / PROM / Strength Strength   Strength   Overall Strength Within functional limits for tasks performed   Strength Assessment Site Hip;Knee   Right/Left Hip Right   Right Hip Flexion 5/5   Right Hip ABduction 5/5   Right/Left Knee Right   Right Knee Flexion 5/5   Right Knee Extension 5/5                     [facility] Adult PT Treatment/Exercise - 05/22/15 0001    Knee/Hip Exercises: Aerobic   Stationary Bike L0 x12 min, seat 4   Knee/Hip Exercises: Machines for Strengthening   Cybex Knee Extension 10# 3x10 reps   Cybex Knee Flexion 40# 3x10 reps   Cybex Leg Press 2.5 pl, seat 7 3x10 reps   Knee/Hip Exercises: Standing   Lateral Step Up Right;3 sets;10 reps;Hand Hold: 2;Step Height: 8"   Forward Step Up Right;3 sets;10 reps;Hand Hold: 2;Step Height: 8"   Knee/Hip Exercises: Supine   Straight Leg Raises Strengthening;Right;3 sets;10 reps   Knee/Hip Exercises:  Sidelying   Hip ABduction Strengthening;Right;3 sets;10 reps   Modalities   Modalities Vasopneumatic   Vasopneumatic   Number Minutes Vasopneumatic  15 minutes   Vasopnuematic Location  Knee   Vasopneumatic Pressure Medium   Vasopneumatic Temperature  34                     PT Long Term Goals - 05/22/15 0819    PT LONG TERM GOAL #1   Title Ind with a HEP.   Time 8   Period Weeks   Status Achieved   PT LONG TERM GOAL #2   Title Active right knee flexion to 120 degrees.   Time 8   Period Weeks   Status Achieved  AROM R knee flexion 125 deg 05/19/2015   PT LONG TERM GOAL #3   Title Increase knee strength to a solid 5/5 to provide good stability for accomplishment of functional activities   Time 8   Period Weeks   Status Achieved  R hip and knee MMT 5/5 in tested muscle groups 05/22/2015   PT LONG TERM GOAL #4   Title Decrease edema to within 3 cms of non-affected side  to assist with pain reduction and range of motion gains.   Time 8   Period Weeks   Status Not Met  R knee midpatellar circumference 6.2 cm greater than L knee. 05/22/2015               Plan - 05/22/15 0806    Clinical Impression Statement Patient tolerated today's treatment well and tolerated machine strengthening well with no reports of pain or discomfort. Patient has achieved all goals set at evaluation at this time except for R knee edema goal. R hip and knee MMT measured 5/5 thorughout tested muscle groups. R knee midpatellar edema 6.2 cm greater than L knee as of today's measurment. No AD has been used for ambulatoin in clinic since evaluation. Normal vasopneumatic response noted following following removal of the modalities which was utilized to reduce R knee infkammation. Patient denied R knee pain following today' s treatment.   Pt will benefit from skilled therapeutic intervention in order to improve on the following deficits Pain;Decreased activity tolerance;Decreased range of motion   Rehab Potential Excellent   PT Frequency 2x / week   PT Duration 8 weeks   PT Treatment/Interventions ADLs/Self Care Home Management;Electrical Stimulation;Therapeutic activities;Therapeutic exercise;Manual techniques;Patient/family education;Vasopneumatic Device;Passive range of motion   PT Next Visit Plan Continue with MPT POC at MD discretion.   Consulted and Agree with Plan of Care Patient        Problem List Patient Active Problem List   Diagnosis Date Noted  . DJD (degenerative joint disease) of knee 04/16/2015   PHYSICAL THERAPY DISCHARGE SUMMARY  Visits from Start of Care: 5.  Current functional level related to goals / functional outcomes: See above.   Remaining deficits: Excellent progress but continued right knee swelling.   Education / Equipment: HEP. Plan: Patient agrees to discharge.  Patient goals were partially met. Patient is being discharged due to being pleased  with the current functional level.  ?????      Mahayla Haddaway, [place] MPT 05/22/2015, 9:24 AM  [facility] [address] [place], [place], [address] Phone: [phone]   Fax:  [phone]  Name: Duane Barrera MRN: [id number] Date of Birth: 04-May-1970

## 2015-05-27 DIAGNOSIS — Z96651 Presence of right artificial knee joint: Secondary | ICD-10-CM | POA: Diagnosis not present

## 2015-07-08 DIAGNOSIS — Z96651 Presence of right artificial knee joint: Secondary | ICD-10-CM | POA: Diagnosis not present

## 2015-08-11 DIAGNOSIS — Z13220 Encounter for screening for lipoid disorders: Secondary | ICD-10-CM | POA: Diagnosis not present

## 2015-08-11 DIAGNOSIS — Z0000 Encounter for general adult medical examination without abnormal findings: Secondary | ICD-10-CM | POA: Diagnosis not present

## 2015-08-11 DIAGNOSIS — Z6841 Body Mass Index (BMI) 40.0 and over, adult: Secondary | ICD-10-CM | POA: Diagnosis not present

## 2015-08-11 DIAGNOSIS — R739 Hyperglycemia, unspecified: Secondary | ICD-10-CM | POA: Diagnosis not present

## 2016-04-20 ENCOUNTER — Other Ambulatory Visit: Payer: Self-pay | Admitting: Internal Medicine

## 2016-04-20 DIAGNOSIS — M545 Low back pain: Secondary | ICD-10-CM

## 2016-04-21 MED FILL — METHOCARBAMOL 500 MG TABLET: 500 | 10 days supply | Qty: 30 | Fill #0

## 2016-04-21 MED FILL — METHYLPREDNISOLONE 4 MG TAB: 4 | 6 days supply | Qty: 21 | Fill #0

## 2016-04-28 ENCOUNTER — Other Ambulatory Visit: Payer: 59

## 2016-05-02 ENCOUNTER — Ambulatory Visit
Admission: RE | Admit: 2016-05-02 | Discharge: 2016-05-02 | Disposition: A | Discharge status: Home / Self Care | Payer: 59 | Source: Ambulatory Visit | Attending: Internal Medicine | Admitting: Internal Medicine

## 2016-05-02 DIAGNOSIS — M545 Low back pain: Secondary | ICD-10-CM

## 2017-02-11 IMAGING — MR MR LUMBAR SPINE W/O CM
4 of 5 series · 26 of 48 positions shown · non-contrast
Comparison: None.

CLINICAL DATA: Low back pain, left hip pain, leg pain for 2-3
months

EXAM:
MRI LUMBAR SPINE WITHOUT CONTRAST
TECHNIQUE: Multiplanar, multisequence MR imaging of the lumbar spine was
performed. No intravenous contrast was administered.

[Series 3: T1 · sagittal · 4.0mm · 0.84mm/px · 6 of 14 slices shown (1 of 2)]
[im 1/14]
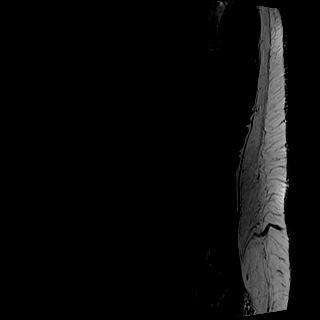
[im 3/14]
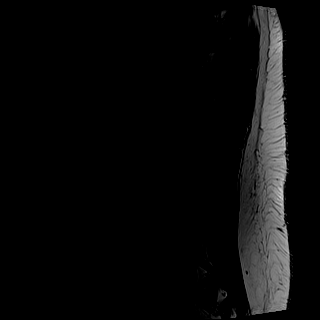
[im 6/14]
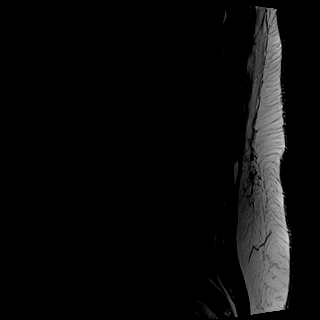
[im 8/14]
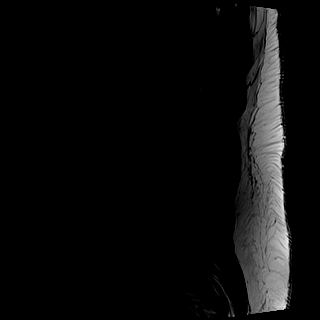
[im 11/14]
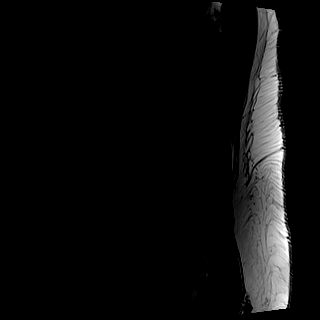
[im 14/14]
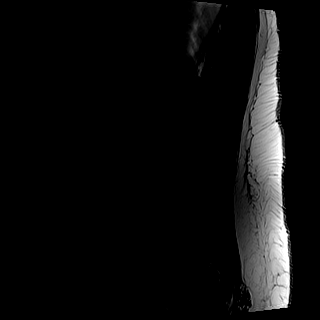

[Series 4: T2 · sagittal · 4.0mm · 0.42mm/px · 6 of 14 slices shown (1 of 2)]
[im 1/14]
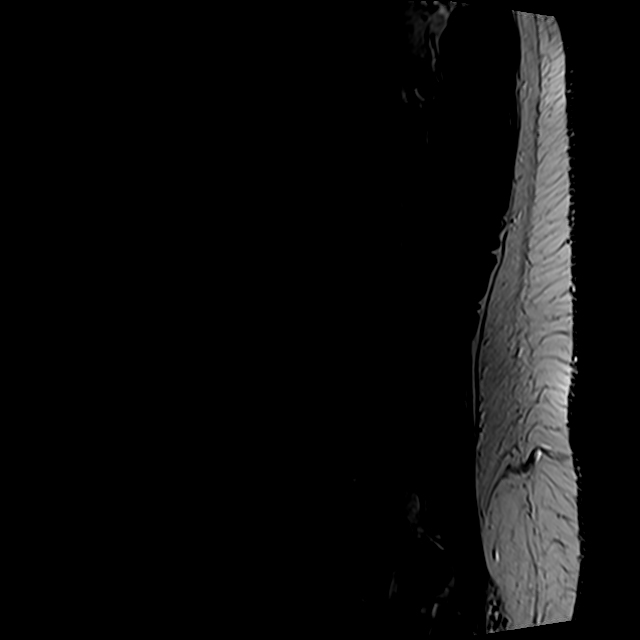
[im 3/14]
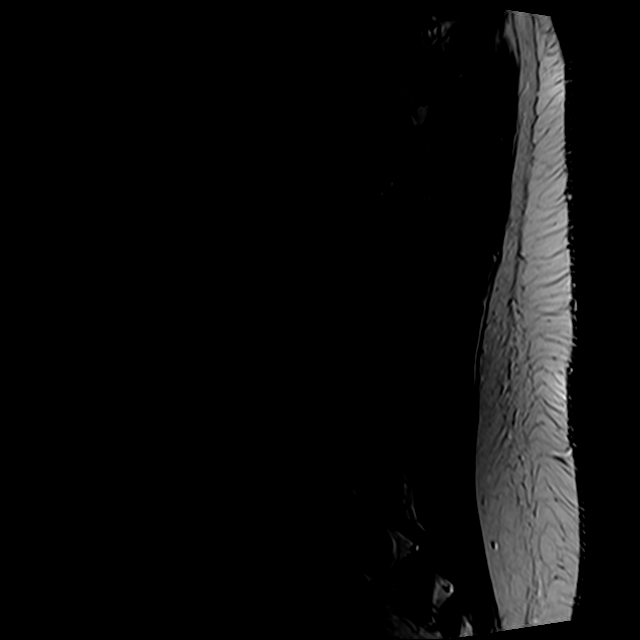
[im 6/14]
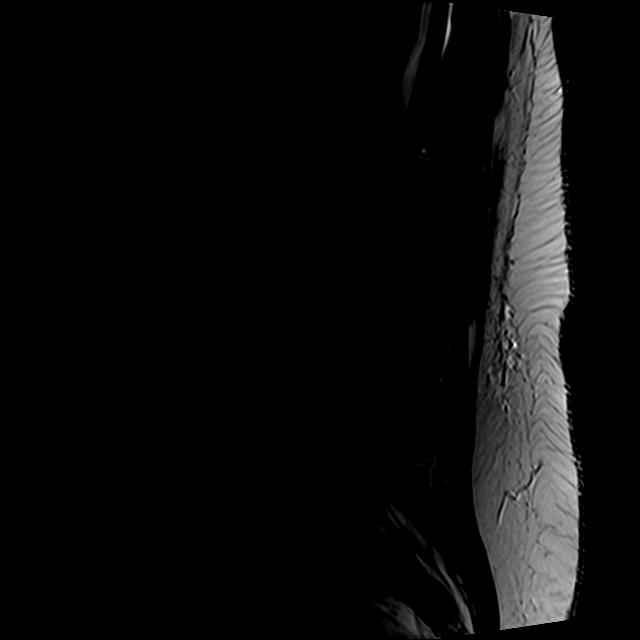
[im 8/14]
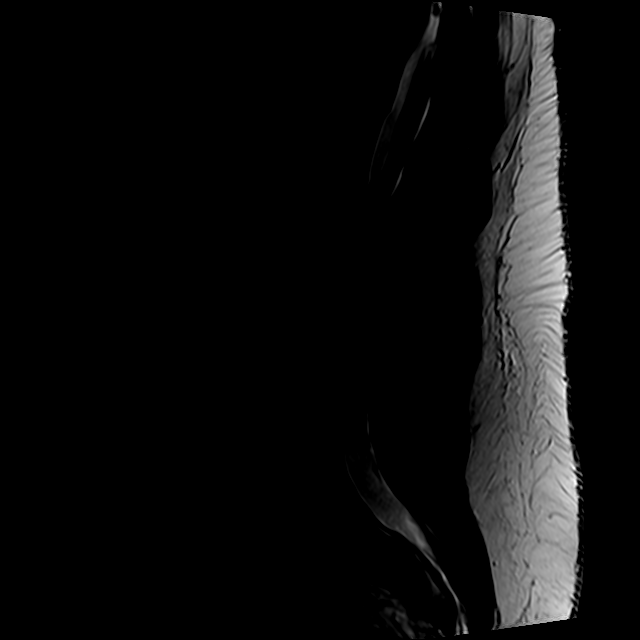
[im 11/14]
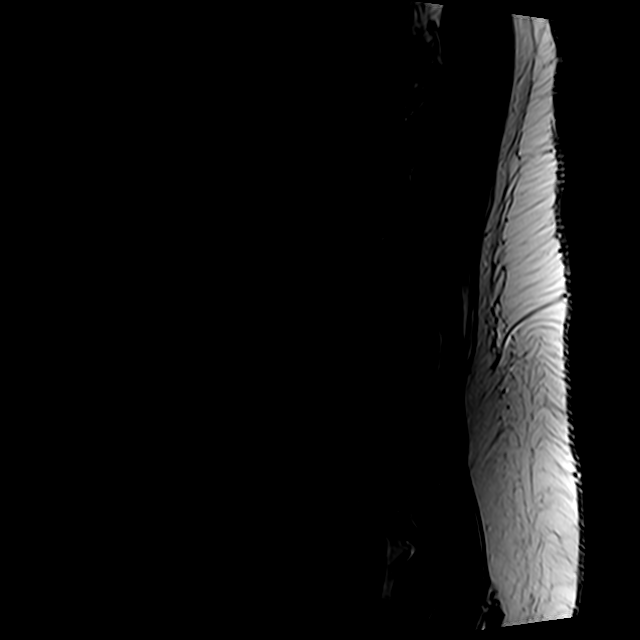
[im 14/14]
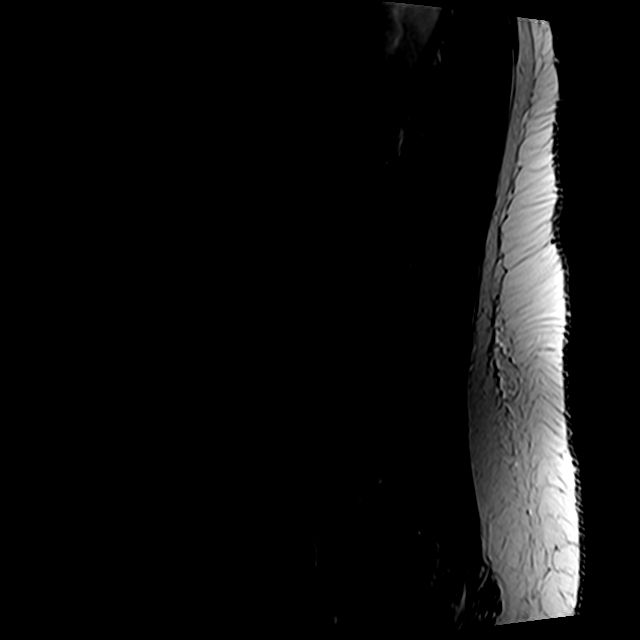

[Series 5: T2 · axial · 4.0mm · 0.70mm/px · z∈[-26,+160]mm · 9 of 34 slices shown (2 of 2)]
[im 1/34]
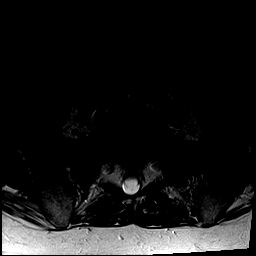
[im 5/34]
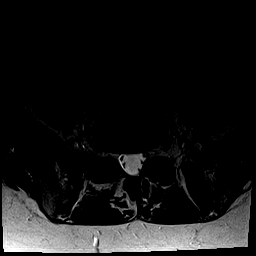
[im 10/34]
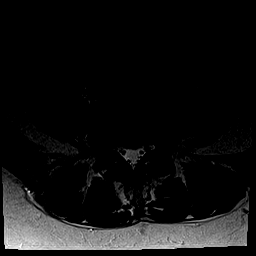
[im 15/34]
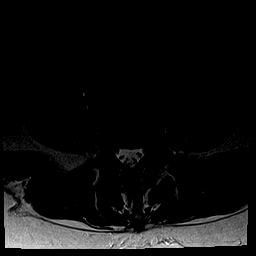
[im 17/34]
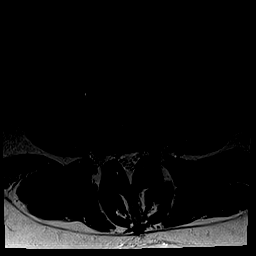
[im 19/34]
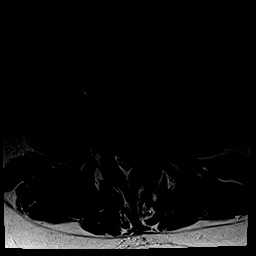
[im 24/34]
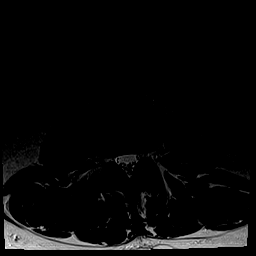
[im 29/34]
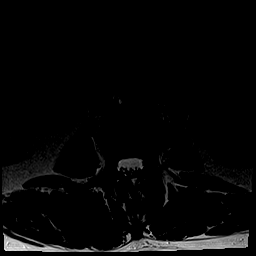
[im 34/34]
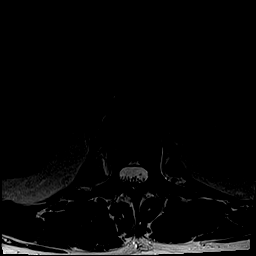

[Series 7: T1 · axial · 4.0mm · 0.35mm/px · z∈[-26,+134]mm · 5 of 34 slices shown (2 of 2)]
[im 1/34]
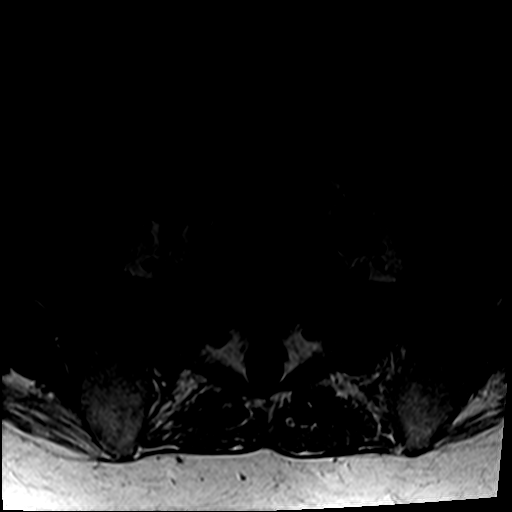
[im 5/34]
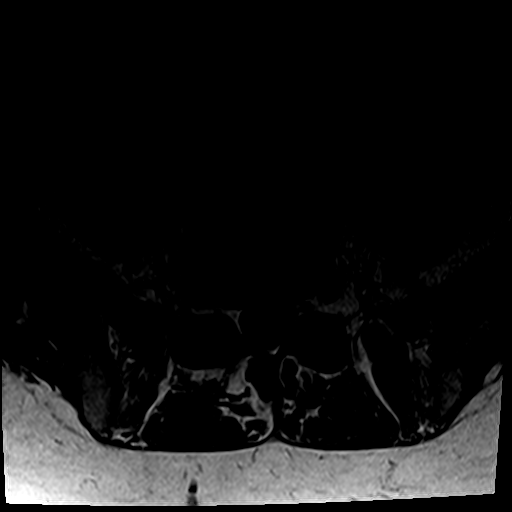
[im 10/34]
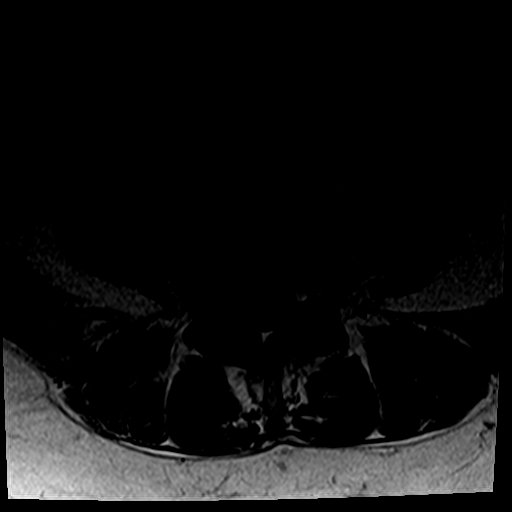
[im 17/34]
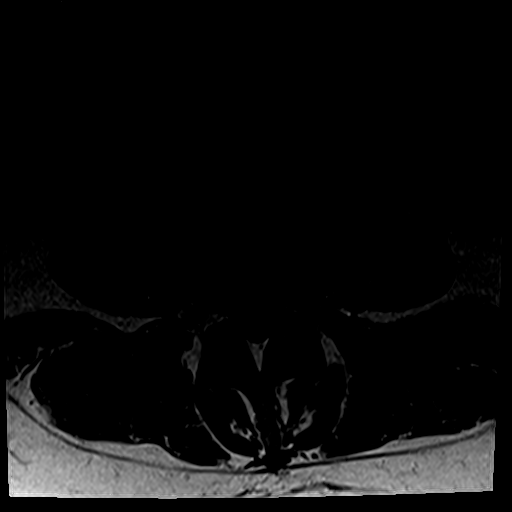
[im 29/34]
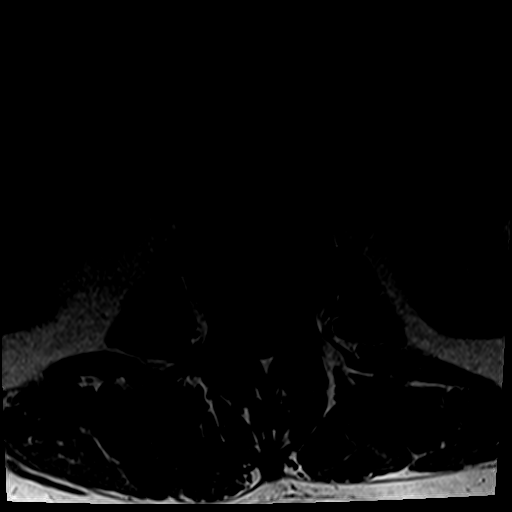

[26 of 48 positions shown; findings below may reference images not displayed]

FINDINGS: Segmentation:  Standard.

Alignment: Grade 1 anterolisthesis of L3 on L4 secondary to facet
disease.

Vertebrae:  No fracture, evidence of discitis, or bone lesion.

Conus medullaris: Extends to the T12 level and appears normal.

Paraspinal and other soft tissues: No focal paraspinal abnormality.

Disc levels:

Disc spaces: Degenerative disease with severe disc height loss and
Modic endplate changes at L3-4. Mild degenerative disc disease with
disc height loss at T2-L1 and L1-2.

L1-2: Mild broad-based disc bulge. No evidence of neural foraminal
stenosis. No central canal stenosis.

L2-3: No significant disc bulge. No evidence of neural foraminal
stenosis. No central canal stenosis.

L3-4: Broad-based disc bulge with a left foraminal disc protrusion.
Severe left foraminal stenosis. Mild-moderate spinal stenosis. Left
lateral recess stenosis. No right foraminal stenosis.

L4-5: Broad-based disc bulge eccentric towards the right with a
small central disc protrusion. No evidence of neural foraminal
stenosis. No central canal stenosis.

L5-S1: Broad-based disc bulge eccentric towards the right. No
evidence of neural foraminal stenosis. No central canal stenosis.
IMPRESSION: 1. At L3-4 there is a broad-based disc bulge with a left foraminal
disc protrusion. Severe left foraminal stenosis. Mild-moderate
spinal stenosis. Left lateral recess stenosis.
2. At L4-5 there is a broad-based disc bulge eccentric towards the
right with a small central disc protrusion.

## 2017-03-01 ENCOUNTER — Ambulatory Visit (INDEPENDENT_AMBULATORY_CARE_PROVIDER_SITE_OTHER): Payer: Self-pay | Admitting: Orthopaedic Surgery

## 2017-06-26 ENCOUNTER — Encounter (HOSPITAL_COMMUNITY): Payer: Self-pay | Admitting: Emergency Medicine

## 2017-06-26 ENCOUNTER — Other Ambulatory Visit: Payer: Self-pay

## 2017-06-26 ENCOUNTER — Emergency Department (HOSPITAL_COMMUNITY)
Admission: EM | Admit: 2017-06-26 | Discharge: 2017-06-26 | Disposition: A | Discharge status: Home / Self Care | Payer: Managed Care, Other (non HMO) | Attending: Physician Assistant | Admitting: Physician Assistant

## 2017-06-26 ENCOUNTER — Emergency Department (HOSPITAL_COMMUNITY): Payer: Managed Care, Other (non HMO)

## 2017-06-26 DIAGNOSIS — N200 Calculus of kidney: Secondary | ICD-10-CM

## 2017-06-26 DIAGNOSIS — Z96651 Presence of right artificial knee joint: Secondary | ICD-10-CM | POA: Diagnosis not present

## 2017-06-26 DIAGNOSIS — Z79899 Other long term (current) drug therapy: Secondary | ICD-10-CM | POA: Insufficient documentation

## 2017-06-26 DIAGNOSIS — N189 Chronic kidney disease, unspecified: Secondary | ICD-10-CM | POA: Insufficient documentation

## 2017-06-26 DIAGNOSIS — R109 Unspecified abdominal pain: Secondary | ICD-10-CM | POA: Diagnosis present

## 2017-06-26 LAB — BASIC METABOLIC PANEL
ANION GAP: 10 (ref 5–15)
BUN: 14 mg/dL (ref 6–20)
CALCIUM: 9.2 mg/dL (ref 8.9–10.3)
CHLORIDE: 107 mmol/L (ref 101–111)
CO2: 21 mmol/L — AB (ref 22–32)
Creatinine, Ser: 1.77 mg/dL — ABNORMAL HIGH (ref 0.61–1.24)
GFR calc non Af Amer: 44 mL/min — ABNORMAL LOW (ref >60)
GFR, EST AFRICAN AMERICAN: 51 mL/min — AB (ref >60)
Glucose, Bld: 156 mg/dL — ABNORMAL HIGH (ref 65–99)
POTASSIUM: 3.5 mmol/L (ref 3.5–5.1)
Sodium: 138 mmol/L (ref 135–145)

## 2017-06-26 LAB — HEPATIC FUNCTION PANEL
ALK PHOS: 60 U/L (ref 38–126)
ALT: 20 U/L (ref 17–63)
AST: 29 U/L (ref 15–41)
Albumin: 3.9 g/dL (ref 3.5–5.0)
BILIRUBIN INDIRECT: 1.1 mg/dL — AB (ref 0.3–0.9)
Bilirubin, Direct: 0.1 mg/dL (ref 0.1–0.5)
TOTAL PROTEIN: 7.2 g/dL (ref 6.5–8.1)
Total Bilirubin: 1.2 mg/dL (ref 0.3–1.2)

## 2017-06-26 LAB — URINALYSIS, ROUTINE W REFLEX MICROSCOPIC
Bilirubin Urine: NEGATIVE
Glucose, UA: NEGATIVE mg/dL
Hgb urine dipstick: NEGATIVE
KETONES UR: NEGATIVE mg/dL
LEUKOCYTES UA: NEGATIVE
NITRITE: NEGATIVE
PH: 5 (ref 5.0–8.0)
Protein, ur: NEGATIVE mg/dL
SPECIFIC GRAVITY, URINE: 1.023 (ref 1.005–1.030)

## 2017-06-26 LAB — CBC
HEMATOCRIT: 40.3 % (ref 39.0–52.0)
HEMOGLOBIN: 13.8 g/dL (ref 13.0–17.0)
MCH: 30.4 pg (ref 26.0–34.0)
MCHC: 34.2 g/dL (ref 30.0–36.0)
MCV: 88.8 fL (ref 78.0–100.0)
Platelets: 218 10*3/uL (ref 150–400)
RBC: 4.54 MIL/uL (ref 4.22–5.81)
RDW: 12.9 % (ref 11.5–15.5)
WBC: 11.9 10*3/uL — ABNORMAL HIGH (ref 4.0–10.5)

## 2017-06-26 LAB — LIPASE, BLOOD: Lipase: 30 U/L (ref 11–51)

## 2017-06-26 MED ORDER — ONDANSETRON 4 MG PO TBDP
4.0000 mg | ORAL_TABLET | Freq: Once | ORAL | Status: AC
  Administered 2017-06-26: 4 mg via ORAL
  Filled 2017-06-26: qty 1

## 2017-06-26 MED ORDER — OXYCODONE-ACETAMINOPHEN 5-325 MG PO TABS: 1.0000 | ORAL_TABLET | Freq: Four times a day (QID) | ORAL | 0 refills | Status: DC | PRN

## 2017-06-26 MED ORDER — ONDANSETRON HCL 4 MG/2ML IJ SOLN
4.0000 mg | Freq: Once | INTRAMUSCULAR | Status: AC
  Administered 2017-06-26: 4 mg via INTRAVENOUS
  Filled 2017-06-26: qty 2

## 2017-06-26 MED ORDER — TAMSULOSIN HCL 0.4 MG PO CAPS: 0.4000 mg | ORAL_CAPSULE | Freq: Every day | ORAL | 0 refills | Status: DC

## 2017-06-26 MED ORDER — ONDANSETRON 4 MG PO TBDP: 4.0000 mg | ORAL_TABLET | Freq: Three times a day (TID) | ORAL | 0 refills | Status: DC | PRN

## 2017-06-26 MED ORDER — SODIUM CHLORIDE 0.9 % IV BOLUS (SEPSIS)
1000.0000 mL | Freq: Once | INTRAVENOUS | Status: AC
  Administered 2017-06-26: 1000 mL via INTRAVENOUS

## 2017-06-26 MED ORDER — KETOROLAC TROMETHAMINE 15 MG/ML IJ SOLN
15.0000 mg | Freq: Once | INTRAMUSCULAR | Status: AC
  Administered 2017-06-26: 15 mg via INTRAVENOUS
  Filled 2017-06-26: qty 1

## 2017-06-26 MED ORDER — FENTANYL CITRATE (PF) 100 MCG/2ML IJ SOLN
50.0000 ug | INTRAMUSCULAR | Status: AC | PRN
  Administered 2017-06-26 (×2): 50 ug via INTRAVENOUS
  Filled 2017-06-26 (×2): qty 2

## 2017-06-26 NOTE — ED Notes (Signed)
Family at bedside. 

## 2017-06-26 NOTE — Discharge Instructions (Signed)
Your kidney stone is 7 mm.  It is likely that you will need help passing this.  Please follow-up with your urologist tomorrow morning.  It may be that they want to perform a an office procedure to help you pass this.  Please return with any fever, worsening pain, or other concerns.

## 2017-06-26 NOTE — ED Triage Notes (Signed)
Reports waking up around 0100.  After urinating had sudden onset of right flank pain with n/v.  Hx of kidney stones.

## 2017-06-26 NOTE — ED Provider Notes (Signed)
[facility] EMERGENCY DEPARTMENT Provider Note   CSN: [id number] Arrival date & time: 06/26/17  0335     History   Chief Complaint Chief Complaint  Patient presents with  . Flank Pain    HPI Duane Barrera is a 47 y.o. male.  HPI   Patient is a 47 yo here with history of kidney stone in 2005. R flank pain radiating R flank to R groin.  Associated with nausea and vomiting. Started at 1 am. No dysuria.  No fevers.    Past Medical History:  Diagnosis Date  . Arthritis    both knees and elbow  . Chronic kidney disease    h/o kidneys 2003-04  . Complication of anesthesia   . GERD (gastroesophageal reflux disease)    otc tums  . Headache    h/o migraines  . History of hiatal hernia   . PONV (postoperative nausea and vomiting)     Patient Active Problem List   Diagnosis Date Noted  . 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 CHOLECYSTECTOMY    . ELBOW ARTHROSCOPY     4 pins in elbow  . MENISCUS REPAIR     right leg  . SHOULDER ARTHROSCOPY     removed distal end and cleaned out "junk" per Dr. Eulah Pont  . 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 WISDOM TOOTH EXTRACTION         Home Medications    Prior to Admission medications   Medication Sig Start Date End Date Taking? Authorizing Provider  apixaban (ELIQUIS) 2.5 MG TABS tablet Take 1 tab po q12 hours x 14 days following surgery to prevent blood clots Patient not taking: Reported on 05/08/2015 04/16/15   Cristie Hem, PA-C  bisacodyl (DULCOLAX) 5 MG EC tablet Take 1 tablet (5 mg total) by mouth daily as needed for moderate constipation. Patient not taking: Reported on 05/08/2015 04/16/15   Cristie Hem, PA-C  methocarbamol (ROBAXIN) 500 MG tablet Take 1 tablet (500 mg total) by mouth 4  (four) times daily. 04/16/15   Cristie Hem, PA-C  ondansetron (ZOFRAN ODT) 4 MG disintegrating tablet Take 1 tablet (4 mg total) by mouth every 8 (eight) hours as needed for nausea or vomiting. 06/26/17   Ainslee Sou Lyn, MD  ondansetron (ZOFRAN) 4 MG tablet Take 1 tablet (4 mg total) by mouth every 8 (eight) hours as needed for nausea or vomiting. Patient not taking: Reported on 05/08/2015 04/16/15   Cristie Hem, PA-C  oxyCODONE-acetaminophen (PERCOCET/ROXICET) 5-325 MG tablet Take 1 tablet by mouth every 6 (six) hours as needed for severe pain. 06/26/17   Breyon Blass Lyn, MD  oxyCODONE-acetaminophen (ROXICET) 5-325 MG tablet Take 1-2 tablets by mouth every 4 (four) hours as needed. 04/16/15   Cristie Hem, PA-C  tamsulosin (FLOMAX) 0.4 MG CAPS capsule Take 1 capsule (0.4 mg total) by mouth daily. 06/26/17   Faye Sanfilippo, Cindee Salt, MD    Family History No family history on file.  Social History Social History   Tobacco Use  . Smoking status: Never Smoker  . Smokeless tobacco: Never Used  Substance Use Topics  . Alcohol use: Yes    Comment: infrequent  . Drug use: No     Allergies   Phenergan [promethazine hcl]  Review of Systems Review of Systems  Constitutional: Negative for activity change.  Respiratory: Negative for shortness of breath.   Cardiovascular: Negative for chest pain.  Gastrointestinal: Negative for abdominal pain.     Physical Exam Updated Vital Signs BP (!) 153/98 (BP Location: Right Arm)   Pulse 81   Temp 98.4 F (36.9 C) (Oral)   Resp 20   Ht 5\' 11"  (1.803 m)   Wt 131.5 kg (290 lb)   SpO2 99%   BMI 40.45 kg/m   Physical Exam  Constitutional: He is oriented to person, place, and time. He appears well-nourished.  HENT:  Head: Normocephalic.  Eyes: Conjunctivae are normal.  Cardiovascular: Normal rate and regular rhythm.  Pulmonary/Chest: Effort normal and breath sounds normal. No respiratory distress.  Abdominal: There is  tenderness.  R CVA pain  Neurological: He is oriented to person, place, and time.  Skin: Skin is warm and dry. He is not diaphoretic.  Psychiatric: He has a normal mood and affect. His behavior is normal.     ED Treatments / Results  Labs (all labs ordered are listed, but only abnormal results are displayed) Labs Reviewed  URINALYSIS, ROUTINE W REFLEX MICROSCOPIC - Abnormal; Notable for the following components:      Result Value   APPearance HAZY (*)    All other components within normal limits  BASIC METABOLIC PANEL - Abnormal; Notable for the following components:   CO2 21 (*)    Glucose, Bld 156 (*)    Creatinine, Ser 1.77 (*)    GFR calc non Af Amer 44 (*)    GFR calc Af Amer 51 (*)    All other components within normal limits  CBC - Abnormal; Notable for the following components:   WBC 11.9 (*)    All other components within normal limits  HEPATIC FUNCTION PANEL - Abnormal; Notable for the following components:   Indirect Bilirubin 1.1 (*)    All other components within normal limits  LIPASE, BLOOD    EKG  EKG Interpretation None       Radiology Ct Renal Stone Study  Result Date: 06/26/2017 CLINICAL DATA:  Patient with right flank pain, nausea and vomiting. EXAM: CT ABDOMEN AND PELVIS WITHOUT CONTRAST TECHNIQUE: Multidetector CT imaging of the abdomen and pelvis was performed following the standard protocol without IV contrast. COMPARISON:  None. FINDINGS: Lower chest: Normal heart size. Atelectasis right lower lung. Elevation right hemidiaphragm. No pleural effusion. Hepatobiliary: Liver is normal in size and contour. No focal hepatic lesion is identified. No intrahepatic or extrahepatic biliary ductal dilatation. Pancreas: Unremarkable Spleen: Unremarkable Adrenals/Urinary Tract: Adrenal glands are normal. Moderate right hydroureteronephrosis to the level of the distal right ureter were there is an obstructing 7 mm stone (image 88; series 3). The urinary bladder is  unremarkable. 2-3 mm stones demonstrated within the mid and inferior pole of the left kidney. No additional right-sided ureteral or renal stones. Stomach/Bowel: Small hiatal hernia. Normal morphology of the stomach. No evidence for small bowel obstruction. No free fluid or free intraperitoneal air. Normal appendix. Vascular/Lymphatic: Normal caliber abdominal aorta. No retroperitoneal lymphadenopathy. Reproductive: Process unremarkable. Other: Bilateral fat containing inguinal hernias, right greater than left. Musculoskeletal: Lumbar spine degenerative changes. No aggressive or acute appearing osseous lesions. IMPRESSION: 1. There is an obstructing 7 mm stone within the distal right ureter resulting in moderate right hydronephrosis. 2. Additional nonobstructing left renal stones. Electronically Signed   By: Annia Belt M.D.   On: 06/26/2017 09:24  Procedures Procedures (including critical care time)  Medications Ordered in ED Medications  sodium chloride 0.9 % bolus 1,000 mL (not administered)  ketorolac (TORADOL) 15 MG/ML injection 15 mg (not administered)  ondansetron (ZOFRAN-ODT) disintegrating tablet 4 mg (not administered)  ondansetron (ZOFRAN) injection 4 mg (4 mg Intravenous Given 06/26/17 0429)  fentaNYL (SUBLIMAZE) injection 50 mcg (50 mcg Intravenous Given 06/26/17 0659)  ondansetron (ZOFRAN) injection 4 mg (4 mg Intravenous Given 06/26/17 0659)     Initial Impression / Assessment and Plan / ED Course  I have reviewed the triage vital signs and the nursing notes.  Pertinent labs & imaging results that were available during my care of the patient were reviewed by me and considered in my medical decision making (see chart for details).     Patient is a 47 yo here with history of kidney stone in 2005. R flank pain radiating R flank to R groin.  Associated with nausea and vomiting. Started at 1 am. No dysuria.  No fevers.  Patient has mild pain on exam.  And relatively  nonfocal.  Given patient's story of acute onset of pain starting while urinating and right CVA pain radiating to groin, I think that stone disease is most likely.  Will do CT stone.  11:18 AM CT shows 7 mm stone that is obstructive.  Likely unable to pass.  Discussion had with patient.  Patient very comfortable after only small amounts of pain medication.  Afebrile.  No evidence of infection.  Patient taking p.o.  Will discharge home with symptomatic medications and have patient call urology in the morning.  Patient agrees and understands return precautions.  Final Clinical Impressions(s) / ED Diagnoses   Final diagnoses:  Kidney stone    ED Discharge Orders        Ordered    oxyCODONE-acetaminophen (PERCOCET/ROXICET) 5-325 MG tablet  Every 6 hours PRN     06/26/17 1117    ondansetron (ZOFRAN ODT) 4 MG disintegrating tablet  Every 8 hours PRN     06/26/17 1117    tamsulosin (FLOMAX) 0.4 MG CAPS capsule  Daily     06/26/17 1117       Azyriah Nevins, Cindee Saltourteney Lyn, MD 06/26/17 1119

## 2017-11-24 ENCOUNTER — Other Ambulatory Visit: Payer: Self-pay | Admitting: Rehabilitation

## 2017-11-24 DIAGNOSIS — M546 Pain in thoracic spine: Secondary | ICD-10-CM

## 2017-12-02 ENCOUNTER — Other Ambulatory Visit: Payer: Managed Care, Other (non HMO)

## 2017-12-04 ENCOUNTER — Ambulatory Visit
Admission: RE | Admit: 2017-12-04 | Discharge: 2017-12-04 | Disposition: A | Discharge status: Home / Self Care | Payer: Managed Care, Other (non HMO) | Source: Ambulatory Visit | Attending: Rehabilitation | Admitting: Rehabilitation

## 2017-12-04 DIAGNOSIS — M546 Pain in thoracic spine: Secondary | ICD-10-CM

## 2020-12-01 ENCOUNTER — Ambulatory Visit: Payer: No Typology Code available for payment source | Admitting: Family Medicine

## 2020-12-01 ENCOUNTER — Other Ambulatory Visit: Payer: Self-pay

## 2020-12-01 ENCOUNTER — Encounter: Payer: Self-pay | Admitting: Family Medicine

## 2020-12-01 VITALS — BP 126/85 | HR 102 | Temp 98.3°F | Ht 71.0 in | Wt 307.2 lb

## 2020-12-01 DIAGNOSIS — Z1211 Encounter for screening for malignant neoplasm of colon: Secondary | ICD-10-CM

## 2020-12-01 DIAGNOSIS — J302 Other seasonal allergic rhinitis: Secondary | ICD-10-CM

## 2020-12-01 DIAGNOSIS — R42 Dizziness and giddiness: Secondary | ICD-10-CM

## 2020-12-01 DIAGNOSIS — Z833 Family history of diabetes mellitus: Secondary | ICD-10-CM | POA: Diagnosis not present

## 2020-12-01 DIAGNOSIS — Z7689 Persons encountering health services in other specified circumstances: Secondary | ICD-10-CM

## 2020-12-01 LAB — BAYER DCA HB A1C WAIVED: HB A1C (BAYER DCA - WAIVED): 6.3 % (ref <7.0)

## 2020-12-01 MED ORDER — MECLIZINE HCL 12.5 MG PO TABS: 12.5000 mg | ORAL_TABLET | Freq: Three times a day (TID) | ORAL | 0 refills | Status: DC | PRN

## 2020-12-01 NOTE — Progress Notes (Signed)
New Patient Office Visit  Subjective:  Patient ID: Duane Barrera, male    DOB: 1970-05-06  Age: 50 y.o. MRN: [id number]  CC:  Chief Complaint  Patient presents with   New Patient (Initial Visit)    HPI Duane Barrera presents for dizziness. It feels like things are moving. This has been going on for the last 2 months. This occurs at various times. It can last for minutes to hours. It often occurs with standing or head movement, but it sometimes happens when he is laying down as well. He sometimes has some nausea with this. He has tried some dramaine without some imporvement with nausea. Denies chest pain, edema, palpitations, HA, visual disturbances, vomiting, weakness, numbness/tingling, ear pain or pressure, or neck pain. He does have congestion in the morning when he first wakes up. Denies neck injury.   He has been fasting for the last 4 hours. There is a family history of DM. He has had an elevated A1c in the past, but he doesn't think it was high enough to be prediabetic.   Past Medical History:  Diagnosis Date   Arthritis    both knees and elbow   Chronic kidney disease    h/o kidneys 2003-04   Complication of anesthesia    GERD (gastroesophageal reflux disease)    otc tums   Headache    h/o migraines   History of hiatal hernia    PONV (postoperative nausea and vomiting)     Past Surgical History:  Procedure Laterality Date   CHOLECYSTECTOMY     ELBOW ARTHROSCOPY     4 pins in elbow   MENISCUS REPAIR     right leg   SHOULDER ARTHROSCOPY     removed distal end and cleaned out "junk" per Dr. Eulah Pont   TOTAL KNEE ARTHROPLASTY Right 04/16/2015   Procedure: RIGHT TOTAL KNEE ARTHROPLASTY;  Surgeon: Loreta Ave, MD;  Location: [facility] OR;  Service: Orthopedics;  Laterality: Right;   WISDOM TOOTH EXTRACTION      Family History  Problem Relation Age of Onset   Hypertension Mother    Asthma Mother    Cancer Father        skin   Arthritis Father    Cancer Maternal  Grandfather    COPD Paternal Grandmother    Heart attack Paternal Grandmother    Cancer Paternal Grandfather        lung cancer    Social History   Socioeconomic History   Marital status: Married    Spouse name: Rayfield Citizen   Number of children: 5   Years of education: 12   Highest education level: High school graduate  Occupational History   Not on file  Tobacco Use   Smoking status: Never   Smokeless tobacco: Never  Vaping Use   Vaping Use: Never used  Substance and Sexual Activity   Alcohol use: Yes    Comment: infrequent   Drug use: No   Sexual activity: Not on file  Other Topics Concern   Not on file  Social History Narrative   Not on file   Social Determinants of Health   Financial Resource Strain: Not on file  Food Insecurity: Not on file  Transportation Needs: Not on file  Physical Activity: Not on file  Stress: Not on file  Social Connections: Not on file  Intimate Partner Violence: Not on file    ROS Review of Systems As per HPI.   Objective:   Today's Vitals:  BP 126/85   Pulse (!) 102   Temp 98.3 F (36.8 C) (Temporal)   Ht $R'5\' 11"'fH$  (1.803 m)   Wt (!) 307 lb 4 oz (139.4 kg)   SpO2 97%   BMI 42.85 kg/m   Physical Exam Vitals and nursing note reviewed.  Constitutional:      General: He is not in acute distress.    Appearance: He is not ill-appearing, toxic-appearing or diaphoretic.  HENT:     Head: Normocephalic and atraumatic.     Right Ear: Tympanic membrane, ear canal and external ear normal.     Left Ear: External ear normal. There is impacted cerumen.  Eyes:     Extraocular Movements: Extraocular movements intact.     Pupils: Pupils are equal, round, and reactive to light.  Cardiovascular:     Rate and Rhythm: Normal rate and regular rhythm.     Heart sounds: Normal heart sounds. No murmur heard. Pulmonary:     Effort: Pulmonary effort is normal. No respiratory distress.     Breath sounds: Normal breath sounds.  Abdominal:      General: Bowel sounds are normal.     Palpations: Abdomen is soft.  Musculoskeletal:     Right lower leg: No edema.     Left lower leg: No edema.  Skin:    General: Skin is warm and dry.  Neurological:     General: No focal deficit present.     Mental Status: He is alert and oriented to person, place, and time.     Motor: No weakness.     Gait: Gait normal.  Psychiatric:        Mood and Affect: Mood normal.        Behavior: Behavior normal.    Assessment & Plan:   Quindon was seen today for new patient (initial visit).  Diagnoses and all orders for this visit:  Vertigo Declined EKG today. Benign exam. Try meclizine as below. Discussed PT referral to [facility] if no improvement.  -     meclizine (ANTIVERT) 12.5 MG tablet; Take 1-2 tablets (12.5-25 mg total) by mouth 3 (three) times daily as needed for dizziness.  Morbid obesity ([place]) Diet and exercise. Labs pending.  -     CBC with Differential/Platelet -     CMP14+EGFR -     Lipid panel -     TSH -     Bayer DCA Hb A1c Waived  Family history of diabetes mellitus Labs pending.  -     Bayer DCA Hb A1c Waived  Colon cancer screening -     Cologuard  Encounter to establish care   Follow-up: Return in about 1 year (around 12/01/2021) for CPE. Return to office for new or worsening symptoms, or if symptoms persist.   The patient indicates understanding of these issues and agrees with the plan.  Gwenlyn Perking, FNP

## 2020-12-01 NOTE — Patient Instructions (Signed)
Vertigo Vertigo is the feeling that you or your surroundings are moving when they are not. This feeling can come and go at any time. Vertigo often goes away on its own. Vertigo can be dangerous if it occurs while you are doing something thatcould endanger yourself or others, such as driving or operating machinery. Your health care provider will do tests to try to determine the cause of your vertigo. Tests will also help your health care provider decide how best totreat your condition. Follow these instructions at home: Eating and drinking     Dehydration can make vertigo worse. Drink enough fluid to keep your urine pale yellow. Do not drink alcohol. Activity Return to your normal activities as told by your health care provider. Ask your health care provider what activities are safe for you. In the morning, first sit up on the side of the bed. When you feel okay, stand slowly while you hold onto something until you know that your balance is fine. Move slowly. Avoid sudden body or head movements or certain positions, as told by your health care provider. If you have trouble walking or keeping your balance, try using a cane for stability. If you feel dizzy or unstable, sit down right away. Avoid doing any tasks that would cause danger to you or others if vertigo occurs. Avoid bending down if you feel dizzy. Place items in your home so that they are easy for you to reach without bending or leaning over. Do not drive or use machinery if you feel dizzy. General instructions Take over-the-counter and prescription medicines only as told by your health care provider. Keep all follow-up visits. This is important. Contact a health care provider if: Your medicines do not relieve your vertigo or they make it worse. Your condition gets worse or you develop new symptoms. You have a fever. You develop nausea or vomiting, or if nausea gets worse. Your family or friends notice any behavioral changes. You  have numbness or a prickling and tingling sensation in part of your body. Get help right away if you: Are always dizzy or you faint. Develop severe headaches. Develop a stiff neck. Develop sensitivity to light. Have difficulty moving or speaking. Have weakness in your hands, arms, or legs. Have changes in your hearing or vision. These symptoms may represent a serious problem that is an emergency. Do not wait to see if the symptoms will go away. Get medical help right away. Call your local emergency services (911 in the [place] Do not drive yourself to the hospital. Summary Vertigo is the feeling that you or your surroundings are moving when they are not. Your health care provider will do tests to try to determine the cause of your vertigo. Follow instructions for home care. You may be told to avoid certain tasks, positions, or movements. Contact a health care provider if your medicines do not relieve your symptoms, or if you have a fever, nausea, vomiting, or changes in behavior. Get help right away if you have severe headaches or difficulty speaking, or you develop hearing or vision problems. This information is not intended to replace advice given to you by your health care provider. Make sure you discuss any questions you have with your healthcare provider. Document Revised: 02/27/2020 Document Reviewed: 02/27/2020 Elsevier Patient Education  2022 [employer] Inc.  

## 2020-12-02 LAB — CMP14+EGFR
ALT: 27 IU/L (ref 0–44)
AST: 28 IU/L (ref 0–40)
Albumin/Globulin Ratio: 1.6 (ref 1.2–2.2)
Albumin: 4.5 g/dL (ref 3.8–4.9)
Alkaline Phosphatase: 76 IU/L (ref 44–121)
BUN/Creatinine Ratio: 11 (ref 9–20)
BUN: 11 mg/dL (ref 6–24)
Bilirubin Total: 0.3 mg/dL (ref 0.0–1.2)
CO2: 27 mmol/L (ref 20–29)
Calcium: 9.6 mg/dL (ref 8.7–10.2)
Chloride: 99 mmol/L (ref 96–106)
Creatinine, Ser: 0.96 mg/dL (ref 0.76–1.27)
Globulin, Total: 2.8 g/dL (ref 1.5–4.5)
Glucose: 101 mg/dL — ABNORMAL HIGH (ref 65–99)
Potassium: 4.1 mmol/L (ref 3.5–5.2)
Sodium: 138 mmol/L (ref 134–144)
Total Protein: 7.3 g/dL (ref 6.0–8.5)
eGFR: 96 mL/min/{1.73_m2} (ref >59)

## 2020-12-02 LAB — CBC WITH DIFFERENTIAL/PLATELET
Basophils Absolute: 0.1 10*3/uL (ref 0.0–0.2)
Basos: 1 %
EOS (ABSOLUTE): 0.3 10*3/uL (ref 0.0–0.4)
Eos: 3 %
Hematocrit: 45.6 % (ref 37.5–51.0)
Hemoglobin: 15.2 g/dL (ref 13.0–17.7)
Immature Grans (Abs): 0 10*3/uL (ref 0.0–0.1)
Immature Granulocytes: 0 %
Lymphocytes Absolute: 3.4 10*3/uL — ABNORMAL HIGH (ref 0.7–3.1)
Lymphs: 39 %
MCH: 29.6 pg (ref 26.6–33.0)
MCHC: 33.3 g/dL (ref 31.5–35.7)
MCV: 89 fL (ref 79–97)
Monocytes Absolute: 0.8 10*3/uL (ref 0.1–0.9)
Monocytes: 9 %
Neutrophils Absolute: 4.2 10*3/uL (ref 1.4–7.0)
Neutrophils: 48 %
Platelets: 242 10*3/uL (ref 150–450)
RBC: 5.13 x10E6/uL (ref 4.14–5.80)
RDW: 11.8 % (ref 11.6–15.4)
WBC: 8.7 10*3/uL (ref 3.4–10.8)

## 2020-12-02 LAB — LIPID PANEL
Chol/HDL Ratio: 3.6 ratio (ref 0.0–5.0)
Cholesterol, Total: 153 mg/dL (ref 100–199)
HDL: 42 mg/dL (ref >39)
LDL Chol Calc (NIH): 81 mg/dL (ref 0–99)
Triglycerides: 173 mg/dL — ABNORMAL HIGH (ref 0–149)
VLDL Cholesterol Cal: 30 mg/dL (ref 5–40)

## 2020-12-02 LAB — TSH: TSH: 0.787 u[IU]/mL (ref 0.450–4.500)

## 2020-12-18 ENCOUNTER — Encounter: Payer: Self-pay | Admitting: Family Medicine

## 2020-12-18 LAB — COLOGUARD: Cologuard: NEGATIVE

## 2021-04-17 ENCOUNTER — Encounter: Payer: Self-pay | Admitting: Family

## 2021-04-17 ENCOUNTER — Ambulatory Visit: Payer: No Typology Code available for payment source | Admitting: Family

## 2021-04-17 VITALS — BP 132/90 | HR 93 | Temp 97.7°F | Ht 71.0 in | Wt 304.2 lb

## 2021-04-17 DIAGNOSIS — J208 Acute bronchitis due to other specified organisms: Secondary | ICD-10-CM | POA: Diagnosis not present

## 2021-04-17 DIAGNOSIS — B9689 Other specified bacterial agents as the cause of diseases classified elsewhere: Secondary | ICD-10-CM

## 2021-04-17 MED ORDER — BENZONATATE 200 MG PO CAPS: 200.0000 mg | ORAL_CAPSULE | Freq: Three times a day (TID) | ORAL | 1 refills | Status: DC | PRN

## 2021-04-17 MED ORDER — PREDNISONE 10 MG (21) PO TBPK: ORAL_TABLET | ORAL | 0 refills | Status: DC

## 2021-04-17 MED ORDER — DOXYCYCLINE HYCLATE 100 MG PO TABS: 100.0000 mg | ORAL_TABLET | Freq: Two times a day (BID) | ORAL | 0 refills | Status: DC

## 2021-04-17 NOTE — Progress Notes (Signed)
Subjective:    Patient ID: Duane Barrera, male    DOB: 1970/12/26, 51 y.o.   MRN: [id number]  Chief Complaint  Patient presents with   Cough    With congestion x 3 weeks     Cough This is a new problem. The current episode started 1 to 4 weeks ago. The problem has been gradually worsening. The problem occurs every few minutes. The cough is Productive of sputum. Associated symptoms include headaches. Pertinent negatives include no chills, ear congestion, ear pain, fever, myalgias, nasal congestion, postnasal drip, shortness of breath or wheezing. He has tried rest and OTC cough suppressant for the symptoms. The treatment provided mild relief.     Review of Systems  Constitutional:  Negative for chills and fever.  HENT:  Negative for ear pain and postnasal drip.   Respiratory:  Positive for cough. Negative for shortness of breath and wheezing.   Musculoskeletal:  Negative for myalgias.  Neurological:  Positive for headaches.  All other systems reviewed and are negative.     Objective:   Physical Exam Vitals reviewed.  Constitutional:      General: He is not in acute distress.    Appearance: He is well-developed. He is obese.  HENT:     Head: Normocephalic.     Right Ear: Tympanic membrane normal.     Left Ear: Tympanic membrane normal.  Eyes:     General:        Right eye: No discharge.        Left eye: No discharge.     Pupils: Pupils are equal, round, and reactive to light.  Neck:     Thyroid: No thyromegaly.  Cardiovascular:     Rate and Rhythm: Normal rate and regular rhythm.     Heart sounds: Normal heart sounds. No murmur heard. Pulmonary:     Effort: Pulmonary effort is normal. No respiratory distress.     Breath sounds: Normal breath sounds. No wheezing.     Comments: Intermittent coarse cough  Abdominal:     General: Bowel sounds are normal. There is no distension.     Palpations: Abdomen is soft.     Tenderness: There is no abdominal tenderness.   Musculoskeletal:        General: No tenderness. Normal range of motion.     Cervical back: Normal range of motion and neck supple.  Skin:    General: Skin is warm and dry.     Findings: No erythema or rash.  Neurological:     Mental Status: He is alert and oriented to person, place, and time.     Cranial Nerves: No cranial nerve deficit.     Deep Tendon Reflexes: Reflexes are normal and symmetric.  Psychiatric:        Behavior: Behavior normal.        Thought Content: Thought content normal.        Judgment: Judgment normal.    BP 132/90    Pulse 93    Temp 97.7 F (36.5 C) (Temporal)    Ht 5\' 11"  (1.803 m)    Wt (!) 304 lb 3.2 oz (138 kg)    SpO2 99%    BMI 42.43 kg/m        Assessment & Plan:  Duane Barrera comes in today with chief complaint of Cough (With congestion x 3 weeks )   Diagnosis and orders addressed:  1. Acute bacterial bronchitis - Take meds as prescribed - Use a  cool mist humidifier  -Use saline nose sprays frequently -Force fluids -For any cough or congestion  Use plain Mucinex- regular strength or max strength is fine -For fever or aces or pains- take tylenol or ibuprofen. -Throat lozenges if help -Follow up if symptoms worsen or do not improve  - doxycycline (VIBRA-TABS) 100 MG tablet; Take 1 tablet (100 mg total) by mouth 2 (two) times daily.  Dispense: 20 tablet; Refill: 0 - predniSONE (STERAPRED UNI-PAK 21 TAB) 10 MG (21) TBPK tablet; Use as directed  Dispense: 21 tablet; Refill: 0 - benzonatate (TESSALON) 200 MG capsule; Take 1 capsule (200 mg total) by mouth 3 (three) times daily as needed.  Dispense: 30 capsule; Refill: 1      Jannifer Rodney, FNP

## 2021-04-17 NOTE — Patient Instructions (Signed)
Acute Bronchitis, Adult °Acute bronchitis is sudden inflammation of the main airways (bronchi) that come off the windpipe (trachea) in the lungs. The swelling causes the airways to get smaller and make more mucus than normal. This can make it hard to breathe and can cause coughing or noisy breathing (wheezing). °Acute bronchitis may last several weeks. The cough may last longer. Allergies, asthma, and exposure to smoke may make the condition worse. °What are the causes? °This condition can be caused by germs and by substances that irritate the lungs, including: °Cold and flu viruses. The most common cause of this condition is the virus that causes the common cold. °Bacteria. This is less common. °Breathing in substances that irritate the lungs, including: °Smoke from cigarettes and other forms of tobacco. °Dust and pollen. °Fumes from household cleaning products, gases, or burned fuel. °Indoor or outdoor air pollution. °What increases the risk? °The following factors may make you more likely to develop this condition: °A weak body's defense system, also called the immune system. °A condition that affects your lungs and breathing, such as asthma. °What are the signs or symptoms? °Common symptoms of this condition include: °Coughing. This may bring up clear, yellow, or green mucus from your lungs (sputum). °Wheezing. °Runny or stuffy nose. °Having too much mucus in your lungs (chest congestion). °Shortness of breath. °Aches and pains, including sore throat or chest. °How is this diagnosed? °This condition is usually diagnosed based on: °Your symptoms and medical history. °A physical exam. °You may also have other tests, including tests to rule out other conditions, such as pneumonia. These tests include: °A test of lung function. °Test of a mucus sample to look for the presence of bacteria. °Tests to check the oxygen level in your blood. °Blood tests. °Chest X-ray. °How is this treated? °Most cases of acute bronchitis  clear up over time without treatment. Your health care provider may recommend: °Drinking more fluids to help thin your mucus so it is easier to cough up. °Taking inhaled medicine (inhaler) to improve air flow in and out of your lungs. °Using a vaporizer or a humidifier. These are machines that add water to the air to help you breathe better. °Taking a medicine that thins mucus and clears congestion (expectorant). °Taking a medicine that prevents or stops coughing (cough suppressant). °It is notcommon to take an antibiotic medicine for this condition. °Follow these instructions at home: ° °Take over-the-counter and prescription medicines only as told by your health care provider. °Use an inhaler, vaporizer, or humidifier as told by your health care provider. °Take two teaspoons (10 mL) of honey at bedtime to lessen coughing at night. °Drink enough fluid to keep your urine pale yellow. °Do not use any products that contain nicotine or tobacco. These products include cigarettes, chewing tobacco, and vaping devices, such as e-cigarettes. If you need help quitting, ask your health care provider. °Get plenty of rest. °Return to your normal activities as told by your health care provider. Ask your health care provider what activities are safe for you. °Keep all follow-up visits. This is important. °How is this prevented? °To lower your risk of getting this condition again: °Wash your hands often with soap and water for at least 20 seconds. If soap and water are not available, use hand sanitizer. °Avoid contact with people who have cold symptoms. °Try not to touch your mouth, nose, or eyes with your hands. °Avoid breathing in smoke or chemical fumes. Breathing smoke or chemical fumes will make your condition   worse. °Get the flu shot every year. °Contact a health care provider if: °Your symptoms do not improve after 2 weeks. °You have trouble coughing up the mucus. °Your cough keeps you awake at night. °You have a  fever. °Get help right away if you: °Cough up blood. °Feel pain in your chest. °Have severe shortness of breath. °Faint or keep feeling like you are going to faint. °Have a severe headache. °Have a fever or chills that get worse. °These symptoms may represent a serious problem that is an emergency. Do not wait to see if the symptoms will go away. Get medical help right away. Call your local emergency services (911 in the [place] Do not drive yourself to the hospital. °Summary °Acute bronchitis is inflammation of the main airways (bronchi) that come off the windpipe (trachea) in the lungs. The swelling causes the airways to get smaller and make more mucus than normal. °Drinking more fluids can help thin your mucus so it is easier to cough up. °Take over-the-counter and prescription medicines only as told by your health care provider. °Do not use any products that contain nicotine or tobacco. These products include cigarettes, chewing tobacco, and vaping devices, such as e-cigarettes. If you need help quitting, ask your health care provider. °Contact a health care provider if your symptoms do not improve after 2 weeks. °This information is not intended to replace advice given to you by your health care provider. Make sure you discuss any questions you have with your health care provider. °Document Revised: 07/30/2020 Document Reviewed: 07/30/2020 °Elsevier Patient Education © 2022 [employer] ° °

## 2021-04-21 ENCOUNTER — Other Ambulatory Visit: Payer: Self-pay | Admitting: Family

## 2021-04-21 MED ORDER — PROMETHAZINE-DM 6.25-15 MG/5ML PO SYRP: 5.0000 mL | ORAL_SOLUTION | Freq: Three times a day (TID) | ORAL | 0 refills | Status: DC | PRN

## 2021-08-20 ENCOUNTER — Ambulatory Visit: Payer: No Typology Code available for payment source | Admitting: Family Medicine

## 2021-08-20 ENCOUNTER — Encounter: Payer: Self-pay | Admitting: Family Medicine

## 2021-08-20 VITALS — BP 129/84 | HR 101 | Temp 97.6°F | Ht 71.0 in | Wt 308.6 lb

## 2021-08-20 DIAGNOSIS — J0100 Acute maxillary sinusitis, unspecified: Secondary | ICD-10-CM | POA: Diagnosis not present

## 2021-08-20 MED ORDER — CEFPROZIL 500 MG PO TABS: 500.0000 mg | ORAL_TABLET | Freq: Two times a day (BID) | ORAL | 0 refills | Status: DC

## 2021-08-20 MED ORDER — PSEUDOEPHEDRINE-GUAIFENESIN ER 120-1200 MG PO TB12: 1.0000 | ORAL_TABLET | Freq: Two times a day (BID) | ORAL | 1 refills | Status: DC

## 2021-08-20 NOTE — Progress Notes (Signed)
Chief Complaint  ?Patient presents with  ? Sinusitis  ? ? ?HPI ? ?Patient presents today for Symptoms include congestion, facial pain, nasal congestion, non productive cough, post nasal drip for 3-4 days and sinus pressure. There is no fever, chills, or sweats. Onset of symptoms was  this morning.  ?Chief Complaint  ?Patient presents with  ? Sinusitis  ? ? ? ?PMH: Smoking status noted ?ROS: Per HPI ? ?Objective: ?BP 129/84   Pulse (!) 101   Temp 97.6 ?F (36.4 ?C)   Ht 5\' 11"  (1.803 m)   Wt (!) 308 lb 9.6 oz (140 kg)   SpO2 99%   BMI 43.04 kg/m?  ?Gen: NAD, alert, cooperative with exam ?HEENT: NCAT, EOMI, PERRL. Nasal passages erythematous & edematous Maxillary tenderness with percussion bilaterally. ?CV: RRR, good S1/S2, no murmur ?Resp: CTABL, no wheezes, non-labored ?Ext: No edema, warm ?Neuro: Alert and oriented, No gross deficits ? ?Assessment and plan: ? ?1. Acute maxillary sinusitis, recurrence not specified   ? ? ?Meds ordered this encounter  ?Medications  ? Pseudoephedrine-Guaifenesin [phone] MG TB12  ?  Sig: Take 1 tablet by mouth 2 (two) times daily. For congestion  ?  Dispense:  12 tablet  ?  Refill:  1  ? cefPROZIL (CEFZIL) 500 MG tablet  ?  Sig: Take 1 tablet (500 mg total) by mouth 2 (two) times daily. For infection. Take all of this medication.  ?  Dispense:  20 tablet  ?  Refill:  0  ? ? ?No orders of the defined types were placed in this encounter. ? ? ?Follow up as needed. ? ? , MD ? ? ? ? ? ?

## 2021-10-26 ENCOUNTER — Encounter: Payer: Self-pay | Admitting: Family Medicine

## 2021-10-26 ENCOUNTER — Ambulatory Visit (INDEPENDENT_AMBULATORY_CARE_PROVIDER_SITE_OTHER): Payer: No Typology Code available for payment source | Admitting: Family Medicine

## 2021-10-26 VITALS — BP 133/84 | HR 72 | Temp 97.0°F | Resp 20 | Ht 71.0 in | Wt 307.5 lb

## 2021-10-26 DIAGNOSIS — K219 Gastro-esophageal reflux disease without esophagitis: Secondary | ICD-10-CM

## 2021-10-26 DIAGNOSIS — M5124 Other intervertebral disc displacement, thoracic region: Secondary | ICD-10-CM

## 2021-10-26 DIAGNOSIS — Z1159 Encounter for screening for other viral diseases: Secondary | ICD-10-CM

## 2021-10-26 DIAGNOSIS — Z0000 Encounter for general adult medical examination without abnormal findings: Secondary | ICD-10-CM

## 2021-10-26 DIAGNOSIS — Z0001 Encounter for general adult medical examination with abnormal findings: Secondary | ICD-10-CM

## 2021-10-26 DIAGNOSIS — Z114 Encounter for screening for human immunodeficiency virus [HIV]: Secondary | ICD-10-CM

## 2021-10-26 DIAGNOSIS — M5126 Other intervertebral disc displacement, lumbar region: Secondary | ICD-10-CM | POA: Diagnosis not present

## 2021-10-26 DIAGNOSIS — N401 Enlarged prostate with lower urinary tract symptoms: Secondary | ICD-10-CM | POA: Diagnosis not present

## 2021-10-26 DIAGNOSIS — R351 Nocturia: Secondary | ICD-10-CM

## 2021-10-26 LAB — BAYER DCA HB A1C WAIVED: HB A1C (BAYER DCA - WAIVED): 6.7 % — ABNORMAL HIGH (ref 4.8–5.6)

## 2021-10-26 MED ORDER — TAMSULOSIN HCL 0.4 MG PO CAPS: 0.4000 mg | ORAL_CAPSULE | Freq: Every day | ORAL | 1 refills | Status: DC

## 2021-10-26 MED ORDER — OMEPRAZOLE 20 MG PO CPDR: 20.0000 mg | DELAYED_RELEASE_CAPSULE | Freq: Every day | ORAL | 1 refills | Status: DC

## 2021-10-26 NOTE — Progress Notes (Signed)
Complete physical exam  Patient: Duane Barrera   DOB: 1971-01-16   51 y.o. Male  MRN: [id number]  Subjective:    Chief Complaint  Patient presents with   Annual Exam    Duane Barrera is a 51 y.o. male who presents today for a complete physical exam. He reports consuming a general diet. The patient does not participate in regular exercise at present. He generally feels well. He reports sleeping no well because he gets up often to urinate. He does have additional problems to discuss today.   He does have to get up to urinate every hour at night. Denies symptoms during the day.   He has heartburn daily. He has regurgitation at night. He reports intermittent nausea. He coughs frequently and often with eating. This has been ongoing for years. Was previously on nexium with good results.   Has a history of lumbar and thoracic disc herniation. He used to see Dr. Percell Miller for ortho and was going to be referred to a neurosurgeon but he was lost to follow up. He would like a referral now. Denies saddle anesthesia, changes in bowel or bladder control. He is unable to exercise due to the pain in his legs.    Most recent fall risk assessment:     No data to display           Most recent depression screenings:    10/26/2021    8:31 AM 08/20/2021   11:44 AM  PHQ 2/9 Scores  PHQ - 2 Score 0 0  PHQ- 9 Score 6 6    Vision:Within last year  Past Medical History:  Diagnosis Date   Arthritis    both knees and elbow   Chronic kidney disease    h/o kidneys [id number]   Complication of anesthesia    GERD (gastroesophageal reflux disease)    otc tums   Headache    h/o migraines   History of hiatal hernia    PONV (postoperative nausea and vomiting)       Patient Care Team: Gwenlyn Perking, FNP as PCP - General (Family Medicine)   Outpatient Medications Prior to Visit  Medication Sig   [DISCONTINUED] cefPROZIL (CEFZIL) 500 MG tablet Take 1 tablet (500 mg total) by mouth 2 (two)  times daily. For infection. Take all of this medication.   [DISCONTINUED] Pseudoephedrine-Guaifenesin [phone] MG TB12 Take 1 tablet by mouth 2 (two) times daily. For congestion   No facility-administered medications prior to visit.    ROS Negative unless specially indicated above in HPI.     Objective:     BP 133/84   Pulse 72   Temp (!) 97 F (36.1 C) (Oral)   Resp 20   Ht $R'5\' 11"'fh$  (1.803 m)   Wt (!) 307 lb 8 oz (139.5 kg)   SpO2 98%   BMI 42.89 kg/m  BP Readings from Last 3 Encounters:  10/26/21 133/84  08/20/21 129/84  04/17/21 132/90      Physical Exam Vitals and nursing note reviewed.  Constitutional:      General: He is not in acute distress.    Appearance: He is obese. He is not ill-appearing, toxic-appearing or diaphoretic.  HENT:     Head: Normocephalic.     Right Ear: Tympanic membrane, ear canal and external ear normal.     Left Ear: Tympanic membrane, ear canal and external ear normal.     Nose: Nose normal.     Mouth/Throat:  Mouth: Mucous membranes are moist.     Pharynx: Oropharynx is clear. No oropharyngeal exudate or posterior oropharyngeal erythema.  Eyes:     General:        Right eye: No discharge.        Left eye: No discharge.     Extraocular Movements: Extraocular movements intact.     Pupils: Pupils are equal, round, and reactive to light.  Neck:     Thyroid: No thyroid mass, thyromegaly or thyroid tenderness.     Vascular: No carotid bruit or JVD.  Cardiovascular:     Rate and Rhythm: Normal rate and regular rhythm.     Heart sounds: Normal heart sounds. No murmur heard. Pulmonary:     Effort: Pulmonary effort is normal. No respiratory distress.     Breath sounds: Normal breath sounds.  Abdominal:     General: Bowel sounds are normal. There is no distension.     Tenderness: There is no abdominal tenderness. There is no guarding or rebound.  Musculoskeletal:        General: No swelling.     Cervical back: No edema, erythema or  rigidity. Normal range of motion.     Right lower leg: No edema.     Left lower leg: No edema.  Lymphadenopathy:     Cervical: No cervical adenopathy.  Skin:    General: Skin is warm and dry.     Findings: No rash.  Neurological:     General: No focal deficit present.     Mental Status: He is alert and oriented to person, place, and time.     Cranial Nerves: No cranial nerve deficit.     Motor: No weakness.     Gait: Gait normal.  Psychiatric:        Mood and Affect: Mood normal.        Behavior: Behavior normal.      No results found for any visits on 10/26/21.     Assessment & Plan:    Routine Health Maintenance and Physical Exam  Duane Barrera was seen today for annual exam.  Diagnoses and all orders for this visit:  Routine general medical examination at a health care facility Fasting labs pending.  -     CBC with Differential/Platelet -     CMP14+EGFR -     Lipid panel -     Thyroid Panel With TSH -     HIV Antibody (routine testing w rflx) -     Hepatitis C antibody -     Bayer DCA Hb A1c Waived  Morbid obesity (HCC) Diet and exercise. Labs pending.  -     CBC with Differential/Platelet -     CMP14+EGFR -     Lipid panel -     Thyroid Panel With TSH -     Bayer DCA Hb A1c Waived  Benign prostatic hyperplasia with nocturia Start flomax. PSA pending.  -     PSA, total and free -     tamsulosin (FLOMAX) 0.4 MG CAPS capsule; Take 1 capsule (0.4 mg total) by mouth daily.  Gastroesophageal reflux disease without esophagitis Start prilosec. Avoid fried, greasy, acidic foods. Small meals. Don't lay down after eating.  -     omeprazole (PRILOSEC) 20 MG capsule; Take 1 capsule (20 mg total) by mouth daily.  Lumbar disc herniation Thoracic disc herniation Seen on imaging in 2018 and 2019. Referral placed.  -     Ambulatory referral to Neurosurgery  Encounter for  screening for HIV -     HIV Antibody (routine testing w rflx)  Encounter for hepatitis C screening  test for low risk patient -     Hepatitis C antibody   Discussed health benefits of physical activity, and encouraged him to engage in regular exercise appropriate for his age and condition.   Return in 6 weeks (on 12/07/2021) for GERD.  The patient indicates understanding of these issues and agrees with the plan.   Gwenlyn Perking, FNP

## 2021-10-26 NOTE — Patient Instructions (Signed)
Health Maintenance, Male Adopting a healthy lifestyle and getting preventive care are important in promoting health and wellness. Ask your health care provider about: The right schedule for you to have regular tests and exams. Things you can do on your own to prevent diseases and keep yourself healthy. What should I know about diet, weight, and exercise? Eat a healthy diet  Eat a diet that includes plenty of vegetables, fruits, low-fat dairy products, and lean protein. Do not eat a lot of foods that are high in solid fats, added sugars, or sodium. Maintain a healthy weight Body mass index (BMI) is a measurement that can be used to identify possible weight problems. It estimates body fat based on height and weight. Your health care provider can help determine your BMI and help you achieve or maintain a healthy weight. Get regular exercise Get regular exercise. This is one of the most important things you can do for your health. Most adults should: Exercise for at least 150 minutes each week. The exercise should increase your heart rate and make you sweat (moderate-intensity exercise). Do strengthening exercises at least twice a week. This is in addition to the moderate-intensity exercise. Spend less time sitting. Even light physical activity can be beneficial. Watch cholesterol and blood lipids Have your blood tested for lipids and cholesterol at 52 years of age, then have this test every 5 years. You may need to have your cholesterol levels checked more often if: Your lipid or cholesterol levels are high. You are older than 52 years of age. You are at high risk for heart disease. What should I know about cancer screening? Many types of cancers can be detected early and may often be prevented. Depending on your health history and family history, you may need to have cancer screening at various ages. This may include screening for: Colorectal cancer. Prostate cancer. Skin cancer. Lung  cancer. What should I know about heart disease, diabetes, and high blood pressure? Blood pressure and heart disease High blood pressure causes heart disease and increases the risk of stroke. This is more likely to develop in people who have high blood pressure readings or are overweight. Talk with your health care provider about your target blood pressure readings. Have your blood pressure checked: Every 3-5 years if you are 18-39 years of age. Every year if you are 40 years old or older. If you are between the ages of 65 and 75 and are a current or former smoker, ask your health care provider if you should have a one-time screening for abdominal aortic aneurysm (AAA). Diabetes Have regular diabetes screenings. This checks your fasting blood sugar level. Have the screening done: Once every three years after age 45 if you are at a normal weight and have a low risk for diabetes. More often and at a younger age if you are overweight or have a high risk for diabetes. What should I know about preventing infection? Hepatitis B If you have a higher risk for hepatitis B, you should be screened for this virus. Talk with your health care provider to find out if you are at risk for hepatitis B infection. Hepatitis C Blood testing is recommended for: Everyone born from 1945 through 1965. Anyone with known risk factors for hepatitis C. Sexually transmitted infections (STIs) You should be screened each year for STIs, including gonorrhea and chlamydia, if: You are sexually active and are younger than 52 years of age. You are older than 52 years of age and your   health care provider tells you that you are at risk for this type of infection. Your sexual activity has changed since you were last screened, and you are at increased risk for chlamydia or gonorrhea. Ask your health care provider if you are at risk. Ask your health care provider about whether you are at high risk for HIV. Your health care provider  may recommend a prescription medicine to help prevent HIV infection. If you choose to take medicine to prevent HIV, you should first get tested for HIV. You should then be tested every 3 months for as long as you are taking the medicine. Follow these instructions at home: Alcohol use Do not drink alcohol if your health care provider tells you not to drink. If you drink alcohol: Limit how much you have to 0-2 drinks a day. Know how much alcohol is in your drink. In the [place] one drink equals one 12 oz bottle of beer (355 mL), one 5 oz glass of wine (148 mL), or one 1 oz glass of hard liquor (44 mL). Lifestyle Do not use any products that contain nicotine or tobacco. These products include cigarettes, chewing tobacco, and vaping devices, such as e-cigarettes. If you need help quitting, ask your health care provider. Do not use street drugs. Do not share needles. Ask your health care provider for help if you need support or information about quitting drugs. General instructions Schedule regular health, dental, and eye exams. Stay current with your vaccines. Tell your health care provider if: You often feel depressed. You have ever been abused or do not feel safe at home. Summary Adopting a healthy lifestyle and getting preventive care are important in promoting health and wellness. Follow your health care provider's instructions about healthy diet, exercising, and getting tested or screened for diseases. Follow your health care provider's instructions on monitoring your cholesterol and blood pressure. This information is not intended to replace advice given to you by your health care provider. Make sure you discuss any questions you have with your health care provider. Document Revised: 08/18/2020 Document Reviewed: 08/18/2020 Elsevier Patient Education  2023 Else[employer] Inc.  

## 2021-10-27 LAB — PSA, TOTAL AND FREE
PSA, Free Pct: 31.3 %
PSA, Free: 0.25 ng/mL
Prostate Specific Ag, Serum: 0.8 ng/mL (ref 0.0–4.0)

## 2021-10-27 LAB — CMP14+EGFR
ALT: 24 IU/L (ref 0–44)
AST: 23 IU/L (ref 0–40)
Albumin/Globulin Ratio: 1.3 (ref 1.2–2.2)
Albumin: 4 g/dL (ref 3.8–4.9)
Alkaline Phosphatase: 67 IU/L (ref 44–121)
BUN/Creatinine Ratio: 10 (ref 9–20)
BUN: 12 mg/dL (ref 6–24)
Bilirubin Total: 0.7 mg/dL (ref 0.0–1.2)
CO2: 25 mmol/L (ref 20–29)
Calcium: 9.6 mg/dL (ref 8.7–10.2)
Chloride: 104 mmol/L (ref 96–106)
Creatinine, Ser: 1.16 mg/dL (ref 0.76–1.27)
Globulin, Total: 3 g/dL (ref 1.5–4.5)
Glucose: 133 mg/dL — ABNORMAL HIGH (ref 70–99)
Potassium: 4.6 mmol/L (ref 3.5–5.2)
Sodium: 143 mmol/L (ref 134–144)
Total Protein: 7 g/dL (ref 6.0–8.5)
eGFR: 76 mL/min/{1.73_m2} (ref >59)

## 2021-10-27 LAB — CBC WITH DIFFERENTIAL/PLATELET
Basophils Absolute: 0.1 10*3/uL (ref 0.0–0.2)
Basos: 1 %
EOS (ABSOLUTE): 0.2 10*3/uL (ref 0.0–0.4)
Eos: 2 %
Hematocrit: 40.1 % (ref 37.5–51.0)
Hemoglobin: 13.4 g/dL (ref 13.0–17.7)
Immature Grans (Abs): 0 10*3/uL (ref 0.0–0.1)
Immature Granulocytes: 0 %
Lymphocytes Absolute: 2.7 10*3/uL (ref 0.7–3.1)
Lymphs: 38 %
MCH: 29.6 pg (ref 26.6–33.0)
MCHC: 33.4 g/dL (ref 31.5–35.7)
MCV: 89 fL (ref 79–97)
Monocytes Absolute: 0.6 10*3/uL (ref 0.1–0.9)
Monocytes: 8 %
Neutrophils Absolute: 3.6 10*3/uL (ref 1.4–7.0)
Neutrophils: 51 %
Platelets: 226 10*3/uL (ref 150–450)
RBC: 4.53 x10E6/uL (ref 4.14–5.80)
RDW: 12.8 % (ref 11.6–15.4)
WBC: 7.1 10*3/uL (ref 3.4–10.8)

## 2021-10-27 LAB — LIPID PANEL
Chol/HDL Ratio: 3.1 ratio (ref 0.0–5.0)
Cholesterol, Total: 126 mg/dL (ref 100–199)
HDL: 41 mg/dL (ref >39)
LDL Chol Calc (NIH): 63 mg/dL (ref 0–99)
Triglycerides: 125 mg/dL (ref 0–149)
VLDL Cholesterol Cal: 22 mg/dL (ref 5–40)

## 2021-10-27 LAB — THYROID PANEL WITH TSH
Free Thyroxine Index: 2.4 (ref 1.2–4.9)
T3 Uptake Ratio: 28 % (ref 24–39)
T4, Total: 8.7 ug/dL (ref 4.5–12.0)
TSH: 0.71 u[IU]/mL (ref 0.450–4.500)

## 2021-10-27 LAB — HIV ANTIBODY (ROUTINE TESTING W REFLEX): HIV Screen 4th Generation wRfx: NONREACTIVE

## 2021-10-27 LAB — HEPATITIS C ANTIBODY: Hep C Virus Ab: NONREACTIVE

## 2021-10-28 ENCOUNTER — Other Ambulatory Visit: Payer: Self-pay | Admitting: Family Medicine

## 2021-10-28 DIAGNOSIS — E1165 Type 2 diabetes mellitus with hyperglycemia: Secondary | ICD-10-CM

## 2021-10-28 MED ORDER — METFORMIN HCL 500 MG PO TABS: 500.0000 mg | ORAL_TABLET | Freq: Two times a day (BID) | ORAL | 3 refills | Status: DC

## 2021-10-30 ENCOUNTER — Encounter: Payer: Self-pay | Admitting: Family Medicine

## 2021-12-07 ENCOUNTER — Encounter: Payer: Self-pay | Admitting: Family Medicine

## 2021-12-07 ENCOUNTER — Ambulatory Visit: Payer: No Typology Code available for payment source | Admitting: Family Medicine

## 2021-12-07 VITALS — BP 119/77 | HR 100 | Temp 98.2°F | Ht 71.0 in | Wt 313.0 lb

## 2021-12-07 DIAGNOSIS — K219 Gastro-esophageal reflux disease without esophagitis: Secondary | ICD-10-CM

## 2021-12-07 DIAGNOSIS — E1165 Type 2 diabetes mellitus with hyperglycemia: Secondary | ICD-10-CM

## 2021-12-07 MED ORDER — DAPAGLIFLOZIN PROPANEDIOL 10 MG PO TABS: 10.0000 mg | ORAL_TABLET | Freq: Every day | ORAL | 1 refills | Status: DC

## 2021-12-07 MED ORDER — BLOOD GLUCOSE METER KIT: PACK | 0 refills | Status: AC

## 2021-12-07 NOTE — Patient Instructions (Signed)

## 2021-12-07 NOTE — Progress Notes (Signed)
   Acute Office Visit  Subjective:     Patient ID: Duane Barrera, male    DOB: 1971/01/10, 51 y.o.   MRN: [id number]  Chief Complaint  Patient presents with   Gastroesophageal Reflux    Gastroesophageal Reflux   Patient is in today for follow up of GERD. Duane Barrera has been on prilosec for 6 weeks. Duane Barrera reports a significant improvement in symptoms. Duane Barrera denies abdominal pain, nausea, vomiting, dysphagia, or weight loss. Duane Barrera has only had heartburn twice since starting treatment. Both times were after eating pizza and resolved with the addition of tums.   Duane Barrera has started on metformin 500 mg BID for T2DM. Duane Barrera has been working on cutting back on carbs. Duane Barrera reports significant fatigue after taking metformin and would really like to try a different medication. Duane Barrera has an upcoming eye exam.   ROS As per HPI.      Objective:    BP 119/77   Pulse 100   Temp 98.2 F (36.8 C) (Temporal)   Ht _0  (1.803 m)   Wt (!) 313 lb (142 kg)   SpO2 96%   BMI 43.65 kg/m    Physical Exam Vitals and nursing note reviewed.  Constitutional:      General: Duane Barrera is not in acute distress.    Appearance: Duane Barrera is not ill-appearing, toxic-appearing or diaphoretic.  Cardiovascular:     Rate and Rhythm: Normal rate and regular rhythm.     Heart sounds: Normal heart sounds. No murmur heard. Pulmonary:     Effort: Pulmonary effort is normal. No respiratory distress.     Breath sounds: Normal breath sounds.  Abdominal:     General: Bowel sounds are normal. There is no distension.     Palpations: Abdomen is soft.     Tenderness: There is no abdominal tenderness. There is no guarding or rebound.  Musculoskeletal:     Right lower leg: No edema.     Left lower leg: No edema.  Skin:    General: Skin is warm and dry.  Neurological:     General: No focal deficit present.     Mental Status: Duane Barrera is alert and oriented to person, place, and time.  Psychiatric:        Mood and Affect: Mood normal.        Behavior: Behavior  normal.     No results found for any visits on 12/07/21.      Assessment & Plan:   Duane Barrera was seen today for gastroesophageal reflux.  Diagnoses and all orders for this visit:  Gastroesophageal reflux disease without esophagitis Well controlled on current regimen.   Type 2 diabetes mellitus with hyperglycemia, without long-term current use of insulin (HCC) Failed metformin due to side effects. Discussed farxiga vs ozempic. Patient is not interested in an injectable at this time. Will start farxiga. Has up coming eye exam. Will do foot exam at next appt.  -     dapagliflozin propanediol (FARXIGA) 10 MG TABS tablet; Take 1 tablet (10 mg total) by mouth daily before breakfast. -     blood glucose meter kit and supplies; Dispense based on patient and insurance preference. Use up to four times daily as directed. (FOR ICD-10 E10.9, E11.9).  Return in about 7 weeks (around 01/26/2022) for chronic follow up.  The patient indicates understanding of these issues and agrees with the plan.  Gwenlyn Perking, FNP

## 2021-12-10 ENCOUNTER — Other Ambulatory Visit: Payer: Self-pay | Admitting: *Deleted

## 2021-12-10 MED ORDER — ACCU-CHEK GUIDE VI STRP: ORAL_STRIP | 3 refills | Status: DC

## 2021-12-10 MED ORDER — ACCU-CHEK SOFTCLIX LANCETS MISC: 3 refills | Status: DC

## 2021-12-10 NOTE — Telephone Encounter (Signed)
Fax from [facility] Request for Accu-Chek guide test strips, will also do lancets, often times when this is added to the meter order they need separate orders for strips & lancets

## 2022-01-01 LAB — HM DIABETES EYE EXAM

## 2022-02-01 ENCOUNTER — Encounter: Payer: Self-pay | Admitting: Family Medicine

## 2022-02-01 ENCOUNTER — Ambulatory Visit: Payer: No Typology Code available for payment source | Admitting: Family Medicine

## 2022-02-01 ENCOUNTER — Other Ambulatory Visit: Payer: Self-pay | Admitting: Family Medicine

## 2022-02-01 VITALS — BP 120/83 | HR 101 | Temp 98.3°F | Ht 71.0 in | Wt 314.2 lb

## 2022-02-01 DIAGNOSIS — N401 Enlarged prostate with lower urinary tract symptoms: Secondary | ICD-10-CM

## 2022-02-01 DIAGNOSIS — K219 Gastro-esophageal reflux disease without esophagitis: Secondary | ICD-10-CM

## 2022-02-01 DIAGNOSIS — E1165 Type 2 diabetes mellitus with hyperglycemia: Secondary | ICD-10-CM

## 2022-02-01 LAB — BAYER DCA HB A1C WAIVED: HB A1C (BAYER DCA - WAIVED): 7.1 % — ABNORMAL HIGH (ref 4.8–5.6)

## 2022-02-01 MED ORDER — INSULIN PEN NEEDLE 32G X 6 MM MISC: 1.0000 | 3 refills | Status: DC

## 2022-02-01 MED ORDER — OZEMPIC (0.25 OR 0.5 MG/DOSE) 2 MG/3ML ~~LOC~~ SOPN: 0.2500 mg | PEN_INJECTOR | SUBCUTANEOUS | 3 refills | Status: DC

## 2022-02-01 NOTE — Patient Instructions (Signed)

## 2022-02-01 NOTE — Progress Notes (Signed)
Established Patient Office Visit  Subjective   Patient ID: Duane Barrera, male    DOB: 01/29/1971  Age: 51 y.o. MRN: [id number]  Chief Complaint  Patient presents with   Medical Management of Chronic Issues   Diabetes    Diabetes He presents for his follow-up diabetic visit. He has type 2 diabetes mellitus. His disease course has been stable. Pertinent negatives for diabetes include no blurred vision, no chest pain, no foot paresthesias, no foot ulcerations, no polydipsia, no polyphagia, no polyuria, no visual change, no weakness and no weight loss. Current diabetic treatment includes oral agent (monotherapy) (stopped farxiga after 2 week due to itching). Meal planning includes avoidance of concentrated sweets. His breakfast blood glucose range is generally 130-140 mg/dl. His lunch blood glucose range is generally 180-200 mg/dl. His dinner blood glucose range is generally 140-180 mg/dl. An ACE inhibitor/angiotensin II receptor blocker is not being taken. He does not see a podiatrist.Eye exam is not current.  Gastroesophageal Reflux He complains of heartburn. He reports no abdominal pain, no belching, no chest pain, no choking, no coughing, no dysphagia, no early satiety, no globus sensation, no hoarse voice, no nausea, no sore throat, no stridor, no tooth decay, no water brash or no wheezing. The problem has been gradually improving. Pertinent negatives include no melena, muscle weakness, orthopnea or weight loss. He has tried a PPI for the symptoms. The treatment provided moderate relief.   He continues to take metformin, however this does make him feel very tired.    Past Medical History:  Diagnosis Date   Arthritis    both knees and elbow   Chronic kidney disease    h/o kidneys 2003-04   Complication of anesthesia    GERD (gastroesophageal reflux disease)    otc tums   Headache    h/o migraines   History of hiatal hernia    PONV (postoperative nausea and vomiting)        Review of Systems  Constitutional:  Negative for weight loss.  HENT:  Negative for hoarse voice and sore throat.   Eyes:  Negative for blurred vision.  Respiratory:  Negative for cough, choking and wheezing.   Cardiovascular:  Negative for chest pain.  Gastrointestinal:  Positive for heartburn. Negative for abdominal pain, dysphagia, melena and nausea.  Musculoskeletal:  Negative for muscle weakness.  Neurological:  Negative for weakness.  Endo/Heme/Allergies:  Negative for polydipsia and polyphagia.      Objective:     BP 120/83   Pulse (!) 101   Temp 98.3 F (36.8 C) (Temporal)   Ht 5\' 11"  (1.803 m)   Wt (!) 314 lb 4 oz (142.5 kg)   SpO2 95%   BMI 43.83 kg/m  BP Readings from Last 3 Encounters:  02/01/22 120/83  12/07/21 119/77  10/26/21 133/84      Physical Exam Vitals and nursing note reviewed.  Constitutional:      General: He is not in acute distress.    Appearance: He is not ill-appearing, toxic-appearing or diaphoretic.  HENT:     Head: Normocephalic and atraumatic.  Cardiovascular:     Rate and Rhythm: Normal rate and regular rhythm.     Heart sounds: Normal heart sounds. No murmur heard. Pulmonary:     Effort: Pulmonary effort is normal. No respiratory distress.     Breath sounds: Normal breath sounds.  Abdominal:     General: Bowel sounds are normal. There is no distension.     Palpations: Abdomen is  soft.     Tenderness: There is no abdominal tenderness. There is no guarding or rebound.  Musculoskeletal:     Right lower leg: No edema.     Left lower leg: No edema.  Skin:    General: Skin is warm and dry.  Neurological:     General: No focal deficit present.     Mental Status: He is alert and oriented to person, place, and time.  Psychiatric:        Mood and Affect: Mood normal.        Behavior: Behavior normal.    No results found for any visits on 02/01/22.  Last CBC Lab Results  Component Value Date   WBC 7.1 10/26/2021   HGB 13.4  10/26/2021   HCT 40.1 10/26/2021   MCV 89 10/26/2021   MCH 29.6 10/26/2021   RDW 12.8 10/26/2021   PLT 226 99/35/7017   Last metabolic panel Lab Results  Component Value Date   GLUCOSE 133 (H) 10/26/2021   NA 143 10/26/2021   K 4.6 10/26/2021   CL 104 10/26/2021   CO2 25 10/26/2021   BUN 12 10/26/2021   CREATININE 1.16 10/26/2021   EGFR 76 10/26/2021   CALCIUM 9.6 10/26/2021   PROT 7.0 10/26/2021   ALBUMIN 4.0 10/26/2021   LABGLOB 3.0 10/26/2021   AGRATIO 1.3 10/26/2021   BILITOT 0.7 10/26/2021   ALKPHOS 67 10/26/2021   AST 23 10/26/2021   ALT 24 10/26/2021   ANIONGAP 10 06/26/2017   Last lipids Lab Results  Component Value Date   CHOL 126 10/26/2021   HDL 41 10/26/2021   LDLCALC 63 10/26/2021   TRIG 125 10/26/2021   CHOLHDL 3.1 10/26/2021   Last hemoglobin A1c Lab Results  Component Value Date   HGBA1C 6.7 (H) 10/26/2021      The ASCVD Risk score (Arnett DK, et al., 2019) failed to calculate for the following reasons:   The valid total cholesterol range is 130 to 320 mg/dL    Assessment & Plan:   Cassiel was seen today for medical management of chronic issues and diabetes.  Diagnoses and all orders for this visit:  Type 2 diabetes mellitus with hyperglycemia, without long-term current use of insulin (HCC) A1c is 7.1 today, not at goal of <7. He discontinued farxiga due to side effects. He is currently on metformin but is having fatigue with this. He was previously hesitant about a injectable but is no interested in trying ozempic. Discussed starting at 0.25 mg weekly x 4 weeks. He will message me in 4 weeks to let me know how he is tolerating it and to discuss increasing to 0.5 mg weekly. Denies family history of medullary thyroid cancer. Urine microalbumin pending. Not of ACE or statin. Cholesterol at goal on recent panel.  -     Microalbumin / creatinine urine ratio -     Bayer DCA Hb A1c Waived -     Semaglutide,0.25 or 0.5MG /DOS, (OZEMPIC, 0.25 OR 0.5  MG/DOSE,) 2 MG/3ML SOPN; Inject 0.25 mg into the skin once a week. -     Insulin Pen Needle 32G X 6 MM MISC; 1 each by Does not apply route once a week.  Morbid obesity (HCC) BMI 43. Will start ozempic for DM. Diet and exercise.   Gastroesophageal reflux disease without esophagitis Well controlled on current regimen. Continue prilosec.   Return if symptoms worsen or fail to improve.   The patient indicates understanding of these issues and agrees with the  plan.  Gwenlyn Perking, FNP

## 2022-02-03 LAB — MICROALBUMIN / CREATININE URINE RATIO
Creatinine, Urine: 219.1 mg/dL
Microalb/Creat Ratio: 3 mg/g creat (ref 0–29)
Microalbumin, Urine: 5.7 ug/mL

## 2022-02-20 ENCOUNTER — Encounter: Payer: Self-pay | Admitting: Family Medicine

## 2022-03-02 ENCOUNTER — Encounter: Payer: Self-pay | Admitting: Family Medicine

## 2022-03-03 ENCOUNTER — Other Ambulatory Visit: Payer: Self-pay | Admitting: Family Medicine

## 2022-03-03 DIAGNOSIS — E1165 Type 2 diabetes mellitus with hyperglycemia: Secondary | ICD-10-CM

## 2022-03-03 MED ORDER — OZEMPIC (0.25 OR 0.5 MG/DOSE) 2 MG/3ML ~~LOC~~ SOPN: 0.5000 mg | PEN_INJECTOR | SUBCUTANEOUS | 3 refills | Status: DC

## 2022-05-05 ENCOUNTER — Encounter: Payer: Self-pay | Admitting: Family Medicine

## 2022-05-05 ENCOUNTER — Ambulatory Visit (INDEPENDENT_AMBULATORY_CARE_PROVIDER_SITE_OTHER): Payer: No Typology Code available for payment source | Admitting: Family Medicine

## 2022-05-05 VITALS — BP 116/76 | HR 98 | Temp 98.4°F | Ht 71.0 in | Wt 310.5 lb

## 2022-05-05 DIAGNOSIS — R0683 Snoring: Secondary | ICD-10-CM

## 2022-05-05 DIAGNOSIS — M5124 Other intervertebral disc displacement, thoracic region: Secondary | ICD-10-CM | POA: Diagnosis not present

## 2022-05-05 DIAGNOSIS — E1165 Type 2 diabetes mellitus with hyperglycemia: Secondary | ICD-10-CM | POA: Diagnosis not present

## 2022-05-05 DIAGNOSIS — K219 Gastro-esophageal reflux disease without esophagitis: Secondary | ICD-10-CM

## 2022-05-05 DIAGNOSIS — M5126 Other intervertebral disc displacement, lumbar region: Secondary | ICD-10-CM

## 2022-05-05 DIAGNOSIS — Z9189 Other specified personal risk factors, not elsewhere classified: Secondary | ICD-10-CM

## 2022-05-05 LAB — BAYER DCA HB A1C WAIVED: HB A1C (BAYER DCA - WAIVED): 6.5 % — ABNORMAL HIGH (ref 4.8–5.6)

## 2022-05-05 MED ORDER — GLIPIZIDE 5 MG PO TABS: 5.0000 mg | ORAL_TABLET | Freq: Two times a day (BID) | ORAL | 3 refills | Status: DC

## 2022-05-05 NOTE — Progress Notes (Signed)
Established Patient Office Visit  Subjective   Patient ID: Duane Barrera, male    DOB: 12-30-1970  Age: 52 y.o. MRN: [id number]  Chief Complaint  Patient presents with   Medical Management of Chronic Issues   Diabetes    HPI T2DM Pt presents for follow up evaluation of Type 2 diabetes mellitus. Patient denies foot ulcerations, hypoglycemia , visual disturbances, and vomiting.  Current diabetic medications include ozempic 0.5 mg- for 6 weeks but hasn't been doing well with side effects. He reports abdominal pain, fatigue, constipation, and severe GERD. He does not wish to continue this. Compliant with meds - Yes  Current monitoring regimen: home blood tests - 3 times daily Home blood sugar records:  130-140s fasting and before eating, 130-160s before bed Any episodes of hypoglycemia? no  Current diet:  continues to work on a heart healthy diet Current exercise:  limited due to back pain  Urine microalbumin UTD? Yes Is He on ACE inhibitor or angiotensin II receptor blocker?  No, declined Is He on statin? No declined Is He on ASA 81 mg daily?  No  2. GERD Compliant with medications - Yes Current medications - omeprazole 40 mg Cough - No Sore throat - No Voice change - No Hemoptysis - No Dysphagia or dyspepsia - No Water brash - Yes. Since starting ozempic Red Flags (weight loss, hematochezia, melena, weight loss, early satiety, fevers, odynophagia, or persistent vomiting) - No  3. Back pain Chronic. This continues to worsen and limit his activity due to the pain. He just started seeing a chiropractor- Dr. Andres Barrera and this has been helpful. He has declined a PT referral. Denies saddle anesthesia, numbness, tingling, changes in bowel or bladder control.   4. Snoring Reports chronic loud snoring. His wife has told him that he stops breathing in his sleep. He also reports daytime fatigue and brain fog. If he sits down during the day, he struggles to stay awake.     Past Medical History:  Diagnosis Date   Arthritis    both knees and elbow   Chronic kidney disease    h/o kidneys [id number]   Complication of anesthesia    GERD (gastroesophageal reflux disease)    otc tums   Headache    h/o migraines   History of hiatal hernia    PONV (postoperative nausea and vomiting)       ROS As per HPI.    Objective:     BP 116/76   Pulse 98   Temp 98.4 F (36.9 C) (Temporal)   Ht 5\' 11"  (1.803 m)   Wt (!) 310 lb 8 oz (140.8 kg)   SpO2 97%   BMI 43.31 kg/m  Wt Readings from Last 3 Encounters:  05/05/22 (!) 310 lb 8 oz (140.8 kg)  02/01/22 (!) 314 lb 4 oz (142.5 kg)  12/07/21 (!) 313 lb (142 kg)      Physical Exam Vitals and nursing note reviewed.  Constitutional:      General: He is not in acute distress.    Appearance: He is obese. He is not ill-appearing, toxic-appearing or diaphoretic.  Cardiovascular:     Rate and Rhythm: Normal rate and regular rhythm.     Heart sounds: Normal heart sounds. No murmur heard. Pulmonary:     Effort: Pulmonary effort is normal. No respiratory distress.     Breath sounds: Normal breath sounds.  Abdominal:     General: Bowel sounds are normal. There is no distension.  Palpations: Abdomen is soft.     Tenderness: There is no abdominal tenderness. There is no guarding or rebound.  Musculoskeletal:     Right lower leg: No edema.     Left lower leg: No edema.  Skin:    General: Skin is warm and dry.  Neurological:     General: No focal deficit present.     Mental Status: He is alert and oriented to person, place, and time.  Psychiatric:        Mood and Affect: Mood normal.        Behavior: Behavior normal.    Diabetic Foot Exam - Simple   Simple Foot Form Diabetic Foot exam was performed with the following findings: Yes 05/05/2022  8:20 AM  Visual Inspection No deformities, no ulcerations, no other skin breakdown bilaterally: Yes Sensation Testing Intact to touch and monofilament testing  bilaterally: Yes Pulse Check Posterior Tibialis and Dorsalis pulse intact bilaterally: Yes Comments Thickened, yellow toenails bilaterally.    No results found for any visits on 05/05/22.    The ASCVD Risk score (Arnett DK, et al., 2019) failed to calculate for the following reasons:   The valid total cholesterol range is 130 to 320 mg/dL    Assessment & Plan:   [name] was seen today for medical management of chronic issues and diabetes.  Diagnoses and all orders for this visit:  Type 2 diabetes mellitus with hyperglycemia, without long-term current use of insulin (HCC) Well controlled on current regimen. Will discontinue ozempic due to side effects. Discussed trying another GLP or mounjaro but he would prefer to try a different class right now. Glipizide BID as below. Declined ACE and statin. Foot exam today.  -     Bayer DCA Hb A1c Waived -     glipiZIDE (GLUCOTROL) 5 MG tablet; Take 1 tablet (5 mg total) by mouth 2 (two) times daily before a meal.  Morbid obesity ([place]) Stable. Diet and exercise as tolerated.   Gastroesophageal reflux disease without esophagitis Will discontinue ozempic which was exacerbating GERD. He will notify office if symptoms do not improve to adjustment in meds.   Thoracic disc herniation Lumbar disc herniation Seeing ciropractor. Declined PT. No red flags.   Loud snoring At risk for sleep apnea With reports apnea. Referral placed.  -     Ambulatory referral to Sleep Studies  Return in about 3 months (around 08/04/2022) for chronic follow up.  The patient indicates understanding of these issues and agrees with the plan.   Gwenlyn Perking, FNP

## 2022-05-16 ENCOUNTER — Telehealth: Payer: No Typology Code available for payment source | Admitting: Family

## 2022-05-16 DIAGNOSIS — J069 Acute upper respiratory infection, unspecified: Secondary | ICD-10-CM | POA: Diagnosis not present

## 2022-05-16 MED ORDER — CETIRIZINE HCL 10 MG PO TABS: 10.0000 mg | ORAL_TABLET | Freq: Every day | ORAL | 1 refills | Status: DC

## 2022-05-16 MED ORDER — FLUTICASONE PROPIONATE 50 MCG/ACT NA SUSP: 2.0000 | Freq: Every day | NASAL | 6 refills | Status: DC

## 2022-05-16 NOTE — Progress Notes (Signed)

## 2022-06-17 ENCOUNTER — Other Ambulatory Visit: Payer: Self-pay | Admitting: Family Medicine

## 2022-06-20 ENCOUNTER — Other Ambulatory Visit: Payer: Self-pay | Admitting: Family Medicine

## 2022-07-17 ENCOUNTER — Other Ambulatory Visit: Payer: Self-pay | Admitting: Family Medicine

## 2022-07-17 DIAGNOSIS — K219 Gastro-esophageal reflux disease without esophagitis: Secondary | ICD-10-CM

## 2022-07-17 DIAGNOSIS — N401 Enlarged prostate with lower urinary tract symptoms: Secondary | ICD-10-CM

## 2022-08-01 ENCOUNTER — Other Ambulatory Visit: Payer: Self-pay | Admitting: Family Medicine

## 2022-08-01 DIAGNOSIS — E1165 Type 2 diabetes mellitus with hyperglycemia: Secondary | ICD-10-CM

## 2022-08-04 ENCOUNTER — Ambulatory Visit: Payer: No Typology Code available for payment source | Admitting: Family Medicine

## 2022-08-04 VITALS — BP 115/79 | HR 94 | Temp 97.7°F | Ht 71.0 in | Wt 319.2 lb

## 2022-08-04 DIAGNOSIS — K219 Gastro-esophageal reflux disease without esophagitis: Secondary | ICD-10-CM

## 2022-08-04 DIAGNOSIS — G4733 Obstructive sleep apnea (adult) (pediatric): Secondary | ICD-10-CM | POA: Diagnosis not present

## 2022-08-04 DIAGNOSIS — E1165 Type 2 diabetes mellitus with hyperglycemia: Secondary | ICD-10-CM | POA: Diagnosis not present

## 2022-08-04 LAB — BAYER DCA HB A1C WAIVED: HB A1C (BAYER DCA - WAIVED): 6.9 % — ABNORMAL HIGH (ref 4.8–5.6)

## 2022-08-04 MED ORDER — PANTOPRAZOLE SODIUM 20 MG PO TBEC
20.0000 mg | DELAYED_RELEASE_TABLET | Freq: Every day | ORAL | 3 refills | Status: DC
Start: 2022-08-04 — End: 2022-08-19

## 2022-08-04 MED ORDER — SITAGLIPTIN PHOSPHATE 25 MG PO TABS: 25.0000 mg | ORAL_TABLET | Freq: Every day | ORAL | 3 refills | Status: DC

## 2022-08-04 NOTE — Progress Notes (Signed)
Established Patient Office Visit  Subjective   Patient ID: Duane Barrera, male    DOB: April 20, 1970  Age: 52 y.o. MRN: [id number]  Chief Complaint  Patient presents with   Medical Management of Chronic Issues   Diabetes   Gastroesophageal Reflux    HPI T2DM Pt presents for follow up evaluation of Type 2 diabetes mellitus. Patient denies foot ulcerations, hypoglycemia , visual disturbances, and vomiting.  Current diabetic medications include ozempic 0.5 mg- for 6 weeks but hasn't been doing well with side effects. He reports abdominal pain, fatigue, constipation, and severe GERD. He does not wish to continue this. Compliant with meds - Yes  Current monitoring regimen: home blood tests - 3 times daily Home blood sugar records:  130s fasting and before eating, 130-160s before bed Any episodes of hypoglycemia? 1-2 episodes a day since starting glipizide   Current diet:  continues to work on a heart healthy diet Current exercise:  limited due to back pain  Urine microalbumin UTD? Yes Is He on ACE inhibitor or angiotensin II receptor blocker?  No, declined Is He on statin? No declined Is He on ASA 81 mg daily?  No  2. GERD Compliant with medications - Yes Current medications - omperazole 40 mg  Cough - No Sore throat - No Voice change - No Hemoptysis - No Dysphagia or dyspepsia - No Water brash - Yes., especially at night Red Flags (weight loss, hematochezia, melena, weight loss, early satiety, fevers, odynophagia, or persistent vomiting) - No  3. OSA Start oral device but has to remove at night due to water brash.    Past Medical History:  Diagnosis Date   Arthritis    both knees and elbow   Chronic kidney disease    h/o kidneys 2003-04   Complication of anesthesia    GERD (gastroesophageal reflux disease)    otc tums   Headache    h/o migraines   History of hiatal hernia    PONV (postoperative nausea and vomiting)       ROS As per HPI.    Objective:      BP 115/79   Pulse 94   Temp 97.7 F (36.5 C) (Temporal)   Ht  (1.803 m)   Wt (!) 319 lb 4 oz (144.8 kg)   SpO2 95%   BMI 44.53 kg/m  Wt Readings from Last 3 Encounters:  08/04/22 (!) 319 lb 4 oz (144.8 kg)  05/05/22 (!) 310 lb 8 oz (140.8 kg)  02/01/22 (!) 314 lb 4 oz (142.5 kg)      Physical Exam Vitals and nursing note reviewed.  Constitutional:      General: He is not in acute distress.    Appearance: He is obese. He is not ill-appearing, toxic-appearing or diaphoretic.  Cardiovascular:     Rate and Rhythm: Normal rate and regular rhythm.     Heart sounds: Normal heart sounds. No murmur heard. Pulmonary:     Effort: Pulmonary effort is normal. No respiratory distress.     Breath sounds: Normal breath sounds.  Abdominal:     General: Bowel sounds are normal. There is no distension.     Palpations: Abdomen is soft.     Tenderness: There is no abdominal tenderness. There is no guarding or rebound.  Musculoskeletal:     Right lower leg: No edema.     Left lower leg: No edema.  Skin:    General: Skin is warm and dry.  Neurological:  General: No focal deficit present.     Mental Status: He is alert and oriented to person, place, and time.  Psychiatric:        Mood and Affect: Mood normal.        Behavior: Behavior normal.    The ASCVD Risk score (Arnett DK, et al., 2019) failed to calculate for the following reasons:   The valid total cholesterol range is 130 to 320 mg/dL    Assessment & Plan:   Duane Barrera was seen today for medical management of chronic issues, diabetes and gastroesophageal reflux.  Diagnoses and all orders for this visit:  Type 2 diabetes mellitus with hyperglycemia, without long-term current use of insulin A1c 6.9 today, at goal of <7. Medication changes today: discontinue glipizide due to hypoglycemia 1-2x a day. He has not tolerated ozempic, farxiga, or metformin. Will try [place]. He has declined ACE/ARB and statin. Eye exam: UTD.  Foot exam: UTD. Urine micro: UTD. Diet and exercise.  -     Bayer DCA Hb A1c Waived -     sitaGLIPtin (JANUVIA) 25 MG tablet; Take 1 tablet (25 mg total) by mouth daily.  Morbid obesity Diet and exercise. BMI 44.  Gastroesophageal reflux disease without esophagitis Uncontrolled. Has failed omeprazole and nexium. -     pantoprazole (PROTONIX) 20 MG tablet; Take 1 tablet (20 mg total) by mouth daily.  OSA Wears oral device.    Return in about 3 months (around 11/03/2022) for CPE.  The patient indicates understanding of these issues and agrees with the plan.   Gabriel Earing, FNP

## 2022-08-06 ENCOUNTER — Ambulatory Visit: Payer: No Typology Code available for payment source | Admitting: Family Medicine

## 2022-08-10 ENCOUNTER — Encounter: Payer: Self-pay | Admitting: Family Medicine

## 2022-08-11 ENCOUNTER — Other Ambulatory Visit: Payer: Self-pay | Admitting: Family Medicine

## 2022-08-11 DIAGNOSIS — K219 Gastro-esophageal reflux disease without esophagitis: Secondary | ICD-10-CM

## 2022-08-16 NOTE — H&P (View-Only) (Signed)
GI Office Note    Referring Provider: Gabriel Earing, FNP Primary Care Physician:  Duane Earing, FNP  Primary Gastroenterologist: Duane Duos. Marletta Lor, DO  Chief Complaint   Chief Complaint  Patient presents with   Gastroesophageal Reflux    Really bad acid reflux. Can't sleep at night because everything comes back up.    History of Present Illness   Duane Barrera is a 52 y.o. male presenting today at the request of Duane Earing, FNP for GERD.   Per PCP notes patient has failed omeprazole and Nexium. Recently started on pantoprazole 20mg  once daily.   Having nocturnal GERD symptoms and vomiting at night with blood present. When gallbladder removed in 2010 the believes he was told he had an ulcer at that time as well and thinks he was also told he had a hiatal hernia. Pain is worse with eating. The last 2 days he did not eat dinner and he did not wake up as soon but still. Not able to use his appliance for sleep apnea due to the reflux. Has never had an EGD that he is aware of. Has had some hematemesis, bright red. Started out as a lot and then it improved. Has happened 2-3 times since the first occurrence. No dysphagia. No abdominal pain. Will drink milk or take tums for relief at home in the middle of the night. Has an adjustable bed. If he is not sitting straight up he has symptoms.   Denies NSAIDs or aspirin powders. Stopped using them in 2017 started back in 2019 and then reduced again shortly after that. Maybe has had Ibuprofen twice in the last year.   No melena or brbrpr.  Has intermittent constipation but not regular. Only has diarrhea if he goes a while without eating.   No alcohol use or tobacco use.   Good appetite, no unintentional weight loss.   Mother had surgery to remove part of her colon due to colon cancer (age 72). Has never had colonoscopy. Did Cologuard in September last year that was negative.   Current Outpatient Medications  Medication Sig  Dispense Refill   Accu-Chek Softclix Lancets lancets TEST BLOOD SUGAR 4 TIMES   DAILY, 100 each 8   blood glucose meter kit and supplies Dispense based on patient and insurance preference. Use up to four times daily as directed. (FOR ICD-10 E10.9, E11.9). 1 each 0   cetirizine (ZYRTEC ALLERGY) 10 MG tablet Take 1 tablet (10 mg total) by mouth daily. 90 tablet 1   fluticasone (FLONASE) 50 MCG/ACT nasal spray Place 2 sprays into both nostrils daily. 16 g 6   glucose blood (ACCU-CHEK GUIDE) test strip Test BS QID Dx E11.65 400 strip 3   pantoprazole (PROTONIX) 20 MG tablet Take 1 tablet (20 mg total) by mouth daily. 90 tablet 3   sitaGLIPtin (JANUVIA) 25 MG tablet Take 1 tablet (25 mg total) by mouth daily. 90 tablet 3   tamsulosin (FLOMAX) 0.4 MG CAPS capsule TAKE 1 CAPSULE BY MOUTH EVERY DAY 90 capsule 0   No current facility-administered medications for this visit.    Past Medical History:  Diagnosis Date   Arthritis    both knees and elbow   Chronic kidney disease    h/o kidneys 2003-04   Complication of anesthesia    GERD (gastroesophageal reflux disease)    otc tums   Headache    h/o migraines   History of hiatal hernia    PONV (postoperative nausea  and vomiting)     Past Surgical History:  Procedure Laterality Date   CHOLECYSTECTOMY     ELBOW ARTHROSCOPY     4 pins in elbow   left meniscus repair     MENISCUS REPAIR     right leg   SHOULDER ARTHROSCOPY     removed distal end and cleaned out "junk" per Dr. Eulah Pont   TOTAL KNEE ARTHROPLASTY Right 04/16/2015   Procedure: RIGHT TOTAL KNEE ARTHROPLASTY;  Surgeon: Loreta Ave, MD;  Location: [facility] OR;  Service: Orthopedics;  Laterality: Right;   WISDOM TOOTH EXTRACTION      Family History  Problem Relation Age of Onset   Hypertension Mother    Asthma Mother    Cancer Father        skin   Arthritis Father    Cancer Maternal Grandfather    COPD Paternal Grandmother    Heart attack Paternal Grandmother    Cancer  Paternal Grandfather        lung cancer    Allergies as of 08/19/2022 - Review Complete 08/19/2022  Allergen Reaction Noted   Farxiga [dapagliflozin] Itching 02/01/2022   Metformin and related Other (See Comments) 08/04/2022   Ozempic (0.25 or 0.5 mg-dose) [semaglutide(0.25 or 0.5mg -dos)]  05/05/2022   Phenergan [promethazine hcl] Nausea And Vomiting 04/02/2015    Social History   Socioeconomic History   Marital status: Married    Spouse name: Duane Barrera   Number of children: 5   Years of education: 12   Highest education level: Some college, no degree  Occupational History   Not on file  Tobacco Use   Smoking status: Never   Smokeless tobacco: Never  Vaping Use   Vaping Use: Never used  Substance and Sexual Activity   Alcohol use: Yes    Comment: infrequent   Drug use: No   Sexual activity: Not on file  Other Topics Concern   Not on file  Social History Narrative   Not on file   Social Determinants of Health   Financial Resource Strain: Low Risk  (08/04/2022)   Overall Financial Resource Strain (CARDIA)    Difficulty of Paying Living Expenses: Not hard at all  Food Insecurity: No Food Insecurity (08/04/2022)   Hunger Vital Sign    Worried About Running Out of Food in the Last Year: Never true    Ran Out of Food in the Last Year: Never true  Transportation Needs: No Transportation Needs (08/04/2022)   PRAPARE - Administrator, Civil Service (Medical): No    Lack of Transportation (Non-Medical): No  Physical Activity: Unknown (08/04/2022)   Exercise Vital Sign    Days of Exercise per Week: 0 days    Minutes of Exercise per Session: Not on file  Stress: No Stress Concern Present (08/04/2022)   [employer] of Occupational Health - Occupational Stress Questionnaire    Feeling of Stress : Not at all  Social Connections: Unknown (08/04/2022)   Social Connection and Isolation Panel [NHANES]    Frequency of Communication with Friends and Family: More than  three times a week    Frequency of Social Gatherings with Friends and Family: More than three times a week    Attends Religious Services: Patient declined    Database administrator or Organizations: No    Attends Engineer, structural: Not on file    Marital Status: Married  Catering manager Violence: Not on file    Review of Systems   Gen:  Denies any fever, chills, fatigue, weight loss, lack of appetite.  CV: Denies chest pain, heart palpitations, peripheral edema, syncope.  Resp: Denies shortness of breath at rest or with exertion. Denies wheezing or cough.  GI: see HPI GU : Denies urinary burning, urinary frequency, urinary hesitancy MS: Denies joint pain, muscle weakness, cramps, or limitation of movement.  Derm: Denies rash, itching, dry skin Psych: Denies depression, anxiety, memory loss, and confusion Heme: Denies bruising, bleeding, and enlarged lymph nodes.  Physical Exam   BP 124/84 (BP Location: Left Arm, Patient Position: Sitting, Cuff Size: Large)   Pulse 94   Temp 97.8 F (36.6 C) (Temporal)   Ht 5\' 10"  (1.778 m)   Wt (!) 317 lb 9.6 oz (144.1 kg)   SpO2 99%   BMI 45.57 kg/m   General:   Alert and oriented. Pleasant and cooperative. Well-nourished and well-developed.  Head:  Normocephalic and atraumatic. Eyes:  Without icterus, sclera clear and conjunctiva pink.  Ears:  Normal auditory acuity. Mouth:  No deformity or lesions, oral mucosa pink.  Lungs:  Clear to auscultation bilaterally. No wheezes, rales, or rhonchi. No distress.  Heart:  S1, S2 present without murmurs appreciated.  Abdomen:  +BS, soft, non-distended. TTP to epigastrium. No HSM noted. No guarding or rebound. No masses appreciated.  Rectal:  Deferred  Msk:  Symmetrical without gross deformities. Normal posture. Extremities:  Without edema. Neurologic:  Alert and  oriented x4;  grossly normal neurologically. Skin:  Intact without significant lesions or rashes. Psych:  Alert and  cooperative. Normal mood and affect.   Assessment   Zadin Lesther Mustard is a 52 y.o. male with a history of diabetes, GERD, OSA on CPAP (not currently using due to gerd) presenting today for further evaluation and management of GERD.   GERD, hematemesis: Symptoms consistent with uncontrolled reflux.  Denies any tobacco use, alcohol use, or NSAID use.  Symptoms primarily nocturnal but does have some burning throughout the day as well.  Unable to eat tomato based products and has been following a bland diet.  Denies any nausea or dysphagia.  Currently only taking pantoprazole 20 mg twice daily.  He has also reported a few instances of hematemesis but denies any melena.  Has gagging sometimes at night related to regurgitation of food and acid.  Will proceed with upper endoscopy in the near future with Dr. Marletta Barrera to assess for esophagitis, gastritis, duodenitis, peptic ulcer disease, AVM, Mallory-Weiss tear, erosions secondary to possible hiatal hernia.  For some immediate relief now we will do a 2-week of Carafate and advised him to increase his pantoprazole to 40 mg twice daily.  Family history of colon cancer in his mother: Mother recently diagnosed with colon cancer and undergoing resection.  She is in her 40s.  He has never had a colonoscopy but reports a negative Cologuard in September 2023.  We discussed the importance of direct visitation with colonoscopy given his family history however he declines at this time.  I advised him that he should have more frequent screening with Cologuard or FIT testing.  PLAN   Proceed with upper endoscopy with propofol by Dr. Marletta Barrera in near future: the risks, benefits, and alternatives have been discussed with the patient in detail. The patient states understanding and desires to proceed. ASA 3 Hold Januvia the morning of procedure.  Increase pantoprazole to 40 mg twice daily Carafate 1g TID for 2 weeks.  GERD diet Avoid NSAIDs and aspirin powders Colonoscopy  offered, patient declined. Follow up  in 8 weeks.    Brooke Bonito, MSN, FNP-BC, AGACNP-BC [facility]

## 2022-08-16 NOTE — Progress Notes (Unsigned)
GI Office Note    Referring Provider: Gabriel Earing, FNP Primary Care Physician:  Gabriel Earing, FNP  Primary Gastroenterologist: Hennie Duos. Marletta Lor, DO  Chief Complaint   Chief Complaint  Patient presents with   Gastroesophageal Reflux    Really bad acid reflux. Can't sleep at night because everything comes back up.    History of Present Illness   Duane Barrera is a 52 y.o. male presenting today at the request of Gabriel Earing, FNP for GERD.   Per PCP notes patient has failed omeprazole and Nexium. Recently started on pantoprazole 20mg  once daily.   Having nocturnal GERD symptoms and vomiting at night with blood present. When gallbladder removed in 2010 the believes he was told he had an ulcer at that time as well and thinks he was also told he had a hiatal hernia. Pain is worse with eating. The last 2 days he did not eat dinner and he did not wake up as soon but still. Not able to use his appliance for sleep apnea due to the reflux. Has never had an EGD that he is aware of. Has had some hematemesis, bright red. Started out as a lot and then it improved. Has happened 2-3 times since the first occurrence. No dysphagia. No abdominal pain. Will drink milk or take tums for relief at home in the middle of the night. Has an adjustable bed. If he is not sitting straight up he has symptoms.   Denies NSAIDs or aspirin powders. Stopped using them in 2017 started back in 2019 and then reduced again shortly after that. Maybe has had Ibuprofen twice in the last year.   No melena or brbrpr.  Has intermittent constipation but not regular. Only has diarrhea if he goes a while without eating.   No alcohol use or tobacco use.   Good appetite, no unintentional weight loss.   Mother had surgery to remove part of her colon due to colon cancer (age 3). Has never had colonoscopy. Did Cologuard in September last year that was negative.   Current Outpatient Medications  Medication Sig  Dispense Refill   Accu-Chek Softclix Lancets lancets TEST BLOOD SUGAR 4 TIMES   DAILY, 100 each 8   blood glucose meter kit and supplies Dispense based on patient and insurance preference. Use up to four times daily as directed. (FOR ICD-10 E10.9, E11.9). 1 each 0   cetirizine (ZYRTEC ALLERGY) 10 MG tablet Take 1 tablet (10 mg total) by mouth daily. 90 tablet 1   fluticasone (FLONASE) 50 MCG/ACT nasal spray Place 2 sprays into both nostrils daily. 16 g 6   glucose blood (ACCU-CHEK GUIDE) test strip Test BS QID Dx E11.65 400 strip 3   pantoprazole (PROTONIX) 20 MG tablet Take 1 tablet (20 mg total) by mouth daily. 90 tablet 3   sitaGLIPtin (JANUVIA) 25 MG tablet Take 1 tablet (25 mg total) by mouth daily. 90 tablet 3   tamsulosin (FLOMAX) 0.4 MG CAPS capsule TAKE 1 CAPSULE BY MOUTH EVERY DAY 90 capsule 0   No current facility-administered medications for this visit.    Past Medical History:  Diagnosis Date   Arthritis    both knees and elbow   Chronic kidney disease    h/o kidneys 2003-04   Complication of anesthesia    GERD (gastroesophageal reflux disease)    otc tums   Headache    h/o migraines   History of hiatal hernia    PONV (postoperative nausea  and vomiting)     Past Surgical History:  Procedure Laterality Date   CHOLECYSTECTOMY     ELBOW ARTHROSCOPY     4 pins in elbow   left meniscus repair     MENISCUS REPAIR     right leg   SHOULDER ARTHROSCOPY     removed distal end and cleaned out "junk" per Dr. Eulah Pont   TOTAL KNEE ARTHROPLASTY Right 04/16/2015   Procedure: RIGHT TOTAL KNEE ARTHROPLASTY;  Surgeon: Loreta Ave, MD;  Location: [facility] OR;  Service: Orthopedics;  Laterality: Right;   WISDOM TOOTH EXTRACTION      Family History  Problem Relation Age of Onset   Hypertension Mother    Asthma Mother    Cancer Father        skin   Arthritis Father    Cancer Maternal Grandfather    COPD Paternal Grandmother    Heart attack Paternal Grandmother    Cancer  Paternal Grandfather        lung cancer    Allergies as of 08/19/2022 - Review Complete 08/19/2022  Allergen Reaction Noted   Farxiga [dapagliflozin] Itching 02/01/2022   Metformin and related Other (See Comments) 08/04/2022   Ozempic (0.25 or 0.5 mg-dose) [semaglutide(0.25 or 0.5mg -dos)]  05/05/2022   Phenergan [promethazine hcl] Nausea And Vomiting 04/02/2015    Social History   Socioeconomic History   Marital status: Married    Spouse name: Duane Barrera   Number of children: 5   Years of education: 12   Highest education level: Some college, no degree  Occupational History   Not on file  Tobacco Use   Smoking status: Never   Smokeless tobacco: Never  Vaping Use   Vaping Use: Never used  Substance and Sexual Activity   Alcohol use: Yes    Comment: infrequent   Drug use: No   Sexual activity: Not on file  Other Topics Concern   Not on file  Social History Narrative   Not on file   Social Determinants of Health   Financial Resource Strain: Low Risk  (08/04/2022)   Overall Financial Resource Strain (CARDIA)    Difficulty of Paying Living Expenses: Not hard at all  Food Insecurity: No Food Insecurity (08/04/2022)   Hunger Vital Sign    Worried About Running Out of Food in the Last Year: Never true    Ran Out of Food in the Last Year: Never true  Transportation Needs: No Transportation Needs (08/04/2022)   PRAPARE - Administrator, Civil Service (Medical): No    Lack of Transportation (Non-Medical): No  Physical Activity: Unknown (08/04/2022)   Exercise Vital Sign    Days of Exercise per Week: 0 days    Minutes of Exercise per Session: Not on file  Stress: No Stress Concern Present (08/04/2022)   [employer] of Occupational Health - Occupational Stress Questionnaire    Feeling of Stress : Not at all  Social Connections: Unknown (08/04/2022)   Social Connection and Isolation Panel [NHANES]    Frequency of Communication with Friends and Family: More than  three times a week    Frequency of Social Gatherings with Friends and Family: More than three times a week    Attends Religious Services: Patient declined    Database administrator or Organizations: No    Attends Engineer, structural: Not on file    Marital Status: Married  Catering manager Violence: Not on file    Review of Systems   Gen:  Denies any fever, chills, fatigue, weight loss, lack of appetite.  CV: Denies chest pain, heart palpitations, peripheral edema, syncope.  Resp: Denies shortness of breath at rest or with exertion. Denies wheezing or cough.  GI: see HPI GU : Denies urinary burning, urinary frequency, urinary hesitancy MS: Denies joint pain, muscle weakness, cramps, or limitation of movement.  Derm: Denies rash, itching, dry skin Psych: Denies depression, anxiety, memory loss, and confusion Heme: Denies bruising, bleeding, and enlarged lymph nodes.  Physical Exam   BP 124/84 (BP Location: Left Arm, Patient Position: Sitting, Cuff Size: Large)   Pulse 94   Temp 97.8 F (36.6 C) (Temporal)   Ht 5\' 10"  (1.778 m)   Wt (!) 317 lb 9.6 oz (144.1 kg)   SpO2 99%   BMI 45.57 kg/m   General:   Alert and oriented. Pleasant and cooperative. Well-nourished and well-developed.  Head:  Normocephalic and atraumatic. Eyes:  Without icterus, sclera clear and conjunctiva pink.  Ears:  Normal auditory acuity. Mouth:  No deformity or lesions, oral mucosa pink.  Lungs:  Clear to auscultation bilaterally. No wheezes, rales, or rhonchi. No distress.  Heart:  S1, S2 present without murmurs appreciated.  Abdomen:  +BS, soft, non-distended. TTP to epigastrium. No HSM noted. No guarding or rebound. No masses appreciated.  Rectal:  Deferred  Msk:  Symmetrical without gross deformities. Normal posture. Extremities:  Without edema. Neurologic:  Alert and  oriented x4;  grossly normal neurologically. Skin:  Intact without significant lesions or rashes. Psych:  Alert and  cooperative. Normal mood and affect.   Assessment   Duane Barrera is a 52 y.o. male with a history of diabetes, GERD, OSA on CPAP (not currently using due to gerd) presenting today for further evaluation and management of GERD.   GERD, hematemesis: Symptoms consistent with uncontrolled reflux.  Denies any tobacco use, alcohol use, or NSAID use.  Symptoms primarily nocturnal but does have some burning throughout the day as well.  Unable to eat tomato based products and has been following a bland diet.  Denies any nausea or dysphagia.  Currently only taking pantoprazole 20 mg twice daily.  He has also reported a few instances of hematemesis but denies any melena.  Has gagging sometimes at night related to regurgitation of food and acid.  Will proceed with upper endoscopy in the near future with Dr. Marletta Lor to assess for esophagitis, gastritis, duodenitis, peptic ulcer disease, AVM, Mallory-Weiss tear, erosions secondary to possible hiatal hernia.  For some immediate relief now we will do a 2-week of Carafate and advised him to increase his pantoprazole to 40 mg twice daily.  Family history of colon cancer in his mother: Mother recently diagnosed with colon cancer and undergoing resection.  She is in her 75s.  He has never had a colonoscopy but reports a negative Cologuard in September 2023.  We discussed the importance of direct visitation with colonoscopy given his family history however he declines at this time.  I advised him that he should have more frequent screening with Cologuard or FIT testing.  PLAN   Proceed with upper endoscopy with propofol by Dr. Marletta Lor in near future: the risks, benefits, and alternatives have been discussed with the patient in detail. The patient states understanding and desires to proceed. ASA 3 Hold Januvia the morning of procedure.  Increase pantoprazole to 40 mg twice daily Carafate 1g TID for 2 weeks.  GERD diet Avoid NSAIDs and aspirin powders Colonoscopy  offered, patient declined. Follow up  in 8 weeks.    Brooke Bonito, MSN, FNP-BC, AGACNP-BC [facility]

## 2022-08-19 ENCOUNTER — Encounter: Payer: Self-pay | Admitting: *Deleted

## 2022-08-19 ENCOUNTER — Other Ambulatory Visit: Payer: Self-pay | Admitting: Gastroenterology

## 2022-08-19 ENCOUNTER — Encounter: Payer: Self-pay | Admitting: Gastroenterology

## 2022-08-19 ENCOUNTER — Ambulatory Visit (INDEPENDENT_AMBULATORY_CARE_PROVIDER_SITE_OTHER): Payer: No Typology Code available for payment source | Admitting: Gastroenterology

## 2022-08-19 VITALS — BP 124/84 | HR 94 | Temp 97.8°F | Ht 70.0 in | Wt 317.6 lb

## 2022-08-19 DIAGNOSIS — K219 Gastro-esophageal reflux disease without esophagitis: Secondary | ICD-10-CM | POA: Diagnosis not present

## 2022-08-19 DIAGNOSIS — Z800 Family history of malignant neoplasm of digestive organs: Secondary | ICD-10-CM

## 2022-08-19 DIAGNOSIS — K920 Hematemesis: Secondary | ICD-10-CM | POA: Diagnosis not present

## 2022-08-19 MED ORDER — SUCRALFATE 1 G PO TABS: 1.0000 g | ORAL_TABLET | Freq: Three times a day (TID) | ORAL | 0 refills | Status: DC

## 2022-08-19 MED ORDER — PANTOPRAZOLE SODIUM 40 MG PO TBEC: 40.0000 mg | DELAYED_RELEASE_TABLET | Freq: Two times a day (BID) | ORAL | 3 refills | Status: DC

## 2022-08-19 NOTE — Patient Instructions (Addendum)
We are scheduling you for an upper endoscopy in the near future.  You will need to hold your [place] the morning of your procedure.  You will receive separate detailed written instructions for your prep.  Increase your pantoprazole to 40 mg twice daily, 30 minutes before breakfast and dinner.  You may take 2 of your 20 mg tablets twice daily until you run out and then pick up the new prescription.  I have also sent in Carafate for you.  You will take 1 tablet and dissolve it in 1-2 ounces of water and take before meals.  Avoid NSAIDs and aspirin powders.  Follow a GERD diet:  Avoid fried, fatty, greasy, spicy, citrus foods. Avoid caffeine and carbonated beverages. Avoid chocolate. Try eating 4-6 small meals a day rather than 3 large meals. Do not eat within 3 hours of laying down. Prop head of bed up on wood or bricks to create a 6 inch incline.  Will follow-up in 8 weeks, sooner if needed.  If you change your mind about performing a colonoscopy please let me know.  It was a pleasure to see you today. I want to create trusting relationships with patients. If you receive a survey regarding your visit,  I greatly appreciate you taking time to fill this out on paper or through your MyChart. I value your feedback.  Brooke Bonito, MSN, FNP-BC, AGACNP-BC [facility]

## 2022-08-20 ENCOUNTER — Encounter: Payer: Self-pay | Admitting: Gastroenterology

## 2022-08-23 ENCOUNTER — Encounter: Payer: Self-pay | Admitting: *Deleted

## 2022-09-02 NOTE — Patient Instructions (Signed)
Carmi Osterkamp  09/02/2022     @PREFPERIOPPHARMACY @   Your procedure is scheduled on  09/09/2022.   Report to [facility] at  1230  P.M.   Call this number if you have problems the morning of surgery:  [phone]  If you experience any cold or flu symptoms such as cough, fever, chills, shortness of breath, etc. between now and your scheduled surgery, please notify us at the above number.   Remember:  Follow the diet instructions given to you by the office.     Your last dose of januvia should be on 09/06/2022.      Take these medicines the morning of surgery with A SIP OF WATER                             zyrtec, pantoprazole, flomax.     Do not wear jewelry, make-up or nail polish, including gel polish,  artificial nails, or any other type of covering on natural nails (fingers and  toes).   Do not wear lotions, powders, or perfumes, or deodorant.  Do not shave 48 hours prior to surgery.  Men may shave face and neck.  Do not bring valuables to the hospital.  [facility] is not responsible for any belongings or valuables.  Contacts, dentures or bridgework may not be worn into surgery.  Leave your suitcase in the car.  After surgery it may be brought to your room.  For patients admitted to the hospital, discharge time will be determined by your treatment team.  Patients discharged the day of surgery will not be allowed to drive home and must have someone with them for 24 hours.    Special instructions:   DO NOT smoke tobacco or vape for 24 hours before your procedure.  Please read over the following fact sheets that you were given. Anesthesia Post-op Instructions and Care and Recovery After Surgery      Upper Endoscopy, Adult, Care After After the procedure, it is common to have a sore throat. It is also common to have: Mild stomach pain or discomfort. Bloating. Nausea. Follow these instructions at home: The instructions below may help you care for  yourself at home. Your health care provider may give you more instructions. If you have questions, ask your health care provider. If you were given a sedative during the procedure, it can affect you for several hours. Do not drive or operate machinery until your health care provider says that it is safe. If you will be going home right after the procedure, plan to have a responsible adult: Take you home from the hospital or clinic. You will not be allowed to drive. Care for you for the time you are told. Follow instructions from your health care provider about what you may eat and drink. Return to your normal activities as told by your health care provider. Ask your health care provider what activities are safe for you. Take over-the-counter and prescription medicines only as told by your health care provider. Contact a health care provider if you: Have a sore throat that lasts longer than one day. Have trouble swallowing. Have a fever. Get help right away if you: Vomit blood or your vomit looks like coffee grounds. Have bloody, black, or tarry stools. Have a very bad sore throat or you cannot swallow. Have difficulty breathing or very bad pain in your chest or abdomen. These symptoms  may be an emergency. Get help right away. Call 911. Do not wait to see if the symptoms will go away. Do not drive yourself to the hospital. Summary After the procedure, it is common to have a sore throat, mild stomach discomfort, bloating, and nausea. If you were given a sedative during the procedure, it can affect you for several hours. Do not drive until your health care provider says that it is safe. Follow instructions from your health care provider about what you may eat and drink. Return to your normal activities as told by your health care provider. This information is not intended to replace advice given to you by your health care provider. Make sure you discuss any questions you have with your health  care provider. Document Revised: 07/08/2021 Document Reviewed: 07/08/2021 Elsevier Patient Education  2024 [employer] Monitored Anesthesia Care, Care After The following information offers guidance on how to care for yourself after your procedure. Your health care provider may also give you more specific instructions. If you have problems or questions, contact your health care provider. What can I expect after the procedure? After the procedure, it is common to have: Tiredness. Little or no memory about what happened during or after the procedure. Impaired judgment when it comes to making decisions. Nausea or vomiting. Some trouble with balance. Follow these instructions at home: For the time period you were told by your health care provider:  Rest. Do not participate in activities where you could fall or become injured. Do not drive or use machinery. Do not drink alcohol. Do not take sleeping pills or medicines that cause drowsiness. Do not make important decisions or sign legal documents. Do not take care of children on your own. Medicines Take over-the-counter and prescription medicines only as told by your health care provider. If you were prescribed antibiotics, take them as told by your health care provider. Do not stop using the antibiotic even if you start to feel better. Eating and drinking Follow instructions from your health care provider about what you may eat and drink. Drink enough fluid to keep your urine pale yellow. If you vomit: Drink clear fluids slowly and in small amounts as you are able. Clear fluids include water, ice chips, low-calorie sports drinks, and fruit juice that has water added to it (diluted fruit juice). Eat light and bland foods in small amounts as you are able. These foods include bananas, applesauce, rice, lean meats, toast, and crackers. General instructions  Have a responsible adult stay with you for the time you are told. It is important  to have someone help care for you until you are awake and alert. If you have sleep apnea, surgery and some medicines can increase your risk for breathing problems. Follow instructions from your health care provider about wearing your sleep device: When you are sleeping. This includes during daytime naps. While taking prescription pain medicines, sleeping medicines, or medicines that make you drowsy. Do not use any products that contain nicotine or tobacco. These products include cigarettes, chewing tobacco, and vaping devices, such as e-cigarettes. If you need help quitting, ask your health care provider. Contact a health care provider if: You feel nauseous or vomit every time you eat or drink. You feel light-headed. You are still sleepy or having trouble with balance after 24 hours. You get a rash. You have a fever. You have redness or swelling around the IV site. Get help right away if: You have trouble breathing. You have new confusion  after you get home. These symptoms may be an emergency. Get help right away. Call 911. Do not wait to see if the symptoms will go away. Do not drive yourself to the hospital. This information is not intended to replace advice given to you by your health care provider. Make sure you discuss any questions you have with your health care provider. Document Revised: 08/24/2021 Document Reviewed: 08/24/2021 Elsevier Patient Education  2024 [employer].

## 2022-09-08 ENCOUNTER — Encounter (HOSPITAL_COMMUNITY)
Admission: RE | Admit: 2022-09-08 | Discharge: 2022-09-08 | Disposition: A | Discharge status: Home / Self Care | Payer: No Typology Code available for payment source | Source: Ambulatory Visit | Attending: Internal Medicine | Admitting: Internal Medicine

## 2022-09-08 ENCOUNTER — Encounter (HOSPITAL_COMMUNITY): Payer: Self-pay

## 2022-09-08 VITALS — BP 124/84 | HR 88 | Temp 97.8°F | Resp 18 | Ht 70.0 in | Wt 317.6 lb

## 2022-09-08 DIAGNOSIS — E1165 Type 2 diabetes mellitus with hyperglycemia: Secondary | ICD-10-CM | POA: Insufficient documentation

## 2022-09-08 DIAGNOSIS — Z01818 Encounter for other preprocedural examination: Secondary | ICD-10-CM | POA: Insufficient documentation

## 2022-09-08 HISTORY — DX: Personal history of urinary calculi: Z87.442

## 2022-09-08 HISTORY — DX: Sleep apnea, unspecified: G47.30

## 2022-09-08 LAB — BASIC METABOLIC PANEL
Anion gap: 8 (ref 5–15)
BUN: 11 mg/dL (ref 6–20)
CO2: 25 mmol/L (ref 22–32)
Calcium: 8.8 mg/dL — ABNORMAL LOW (ref 8.9–10.3)
Chloride: 104 mmol/L (ref 98–111)
Creatinine, Ser: 1.26 mg/dL — ABNORMAL HIGH (ref 0.61–1.24)
GFR, Estimated: >60 mL/min (ref >60)
Glucose, Bld: 142 mg/dL — ABNORMAL HIGH (ref 70–99)
Potassium: 3.7 mmol/L (ref 3.5–5.1)
Sodium: 137 mmol/L (ref 135–145)

## 2022-09-09 ENCOUNTER — Encounter (HOSPITAL_COMMUNITY)
Admission: RE | Disposition: A | Discharge status: Home / Self Care | Payer: Self-pay | Source: Home / Self Care | Attending: Internal Medicine

## 2022-09-09 ENCOUNTER — Ambulatory Visit (HOSPITAL_BASED_OUTPATIENT_CLINIC_OR_DEPARTMENT_OTHER): Payer: No Typology Code available for payment source | Admitting: Certified Registered"

## 2022-09-09 ENCOUNTER — Encounter (HOSPITAL_COMMUNITY): Payer: Self-pay

## 2022-09-09 ENCOUNTER — Ambulatory Visit (HOSPITAL_COMMUNITY): Payer: No Typology Code available for payment source | Admitting: Certified Registered"

## 2022-09-09 ENCOUNTER — Ambulatory Visit (HOSPITAL_COMMUNITY)
Admission: RE | Admit: 2022-09-09 | Discharge: 2022-09-09 | Disposition: A | Discharge status: Home / Self Care | Payer: No Typology Code available for payment source | Attending: Internal Medicine | Admitting: Internal Medicine

## 2022-09-09 DIAGNOSIS — Z6841 Body Mass Index (BMI) 40.0 and over, adult: Secondary | ICD-10-CM | POA: Insufficient documentation

## 2022-09-09 DIAGNOSIS — Z7984 Long term (current) use of oral hypoglycemic drugs: Secondary | ICD-10-CM | POA: Diagnosis not present

## 2022-09-09 DIAGNOSIS — K920 Hematemesis: Secondary | ICD-10-CM | POA: Insufficient documentation

## 2022-09-09 DIAGNOSIS — G4730 Sleep apnea, unspecified: Secondary | ICD-10-CM

## 2022-09-09 DIAGNOSIS — K2970 Gastritis, unspecified, without bleeding: Secondary | ICD-10-CM

## 2022-09-09 DIAGNOSIS — K219 Gastro-esophageal reflux disease without esophagitis: Secondary | ICD-10-CM | POA: Diagnosis not present

## 2022-09-09 DIAGNOSIS — E119 Type 2 diabetes mellitus without complications: Secondary | ICD-10-CM

## 2022-09-09 DIAGNOSIS — R110 Nausea: Secondary | ICD-10-CM | POA: Insufficient documentation

## 2022-09-09 DIAGNOSIS — K449 Diaphragmatic hernia without obstruction or gangrene: Secondary | ICD-10-CM

## 2022-09-09 DIAGNOSIS — R12 Heartburn: Secondary | ICD-10-CM | POA: Insufficient documentation

## 2022-09-09 DIAGNOSIS — R519 Headache, unspecified: Secondary | ICD-10-CM | POA: Insufficient documentation

## 2022-09-09 DIAGNOSIS — Z800 Family history of malignant neoplasm of digestive organs: Secondary | ICD-10-CM | POA: Diagnosis not present

## 2022-09-09 DIAGNOSIS — K5900 Constipation, unspecified: Secondary | ICD-10-CM | POA: Diagnosis not present

## 2022-09-09 HISTORY — PX: BIOPSY: SHX5522

## 2022-09-09 HISTORY — PX: ESOPHAGOGASTRODUODENOSCOPY (EGD) WITH PROPOFOL: SHX5813

## 2022-09-09 LAB — GLUCOSE, CAPILLARY: Glucose-Capillary: 118 mg/dL — ABNORMAL HIGH (ref 70–99)

## 2022-09-09 SURGERY — ESOPHAGOGASTRODUODENOSCOPY (EGD) WITH PROPOFOL
Anesthesia: General

## 2022-09-09 MED ORDER — LIDOCAINE HCL (CARDIAC) PF 100 MG/5ML IV SOSY
PREFILLED_SYRINGE | INTRAVENOUS | Status: DC | PRN
  Administered 2022-09-09: 50 mg via INTRAVENOUS

## 2022-09-09 MED ORDER — PROPOFOL 10 MG/ML IV BOLUS
INTRAVENOUS | Status: DC | PRN
  Administered 2022-09-09: 100 mg via INTRAVENOUS
  Administered 2022-09-09 (×2): 50 mg via INTRAVENOUS

## 2022-09-09 MED ORDER — LACTATED RINGERS IV SOLN: INTRAVENOUS | Status: DC | PRN

## 2022-09-09 MED ORDER — DEXMEDETOMIDINE HCL IN NACL 80 MCG/20ML IV SOLN
INTRAVENOUS | Status: DC | PRN
  Administered 2022-09-09: 8 ug via INTRAVENOUS

## 2022-09-09 NOTE — Progress Notes (Signed)
Dr. Marletta Lor running ahead of schedule. Called patient to ask if he could arrive earlier.  Patient said he could.  He's about 45 minutes away, will head this way.

## 2022-09-09 NOTE — Transfer of Care (Signed)
Immediate Anesthesia Transfer of Care Note  Patient: Duane Barrera  Procedure(s) Performed: ESOPHAGOGASTRODUODENOSCOPY (EGD) WITH PROPOFOL BIOPSY  Patient Location: Short Stay  Anesthesia Type:General  Level of Consciousness: awake  Airway & Oxygen Therapy: Patient Spontanous Breathing  Post-op Assessment: Report given to RN and Post -op Vital signs reviewed and stable  Post vital signs: Reviewed and stable  Last Vitals:  Vitals Value Taken Time  BP    Temp    Pulse    Resp    SpO2      Last Pain:  Vitals:   09/09/22 1206  PainSc: 0-No pain         Complications: No notable events documented.

## 2022-09-09 NOTE — Anesthesia Preprocedure Evaluation (Signed)
Anesthesia Evaluation  Patient identified by MRN, date of birth, ID band Patient awake    Reviewed: Allergy & Precautions, H&P , NPO status , Patient's Chart, lab work & pertinent test results, reviewed documented beta blocker date and time   History of Anesthesia Complications (+) PONV and history of anesthetic complications  Airway Mallampati: II  TM Distance: >3 FB Neck ROM: full    Dental no notable dental hx.    Pulmonary neg pulmonary ROS, sleep apnea    Pulmonary exam normal breath sounds clear to auscultation       Cardiovascular Exercise Tolerance: Good negative cardio ROS  Rhythm:regular Rate:Normal     Neuro/Psych  Headaches negative neurological ROS  negative psych ROS   GI/Hepatic negative GI ROS, Neg liver ROS, hiatal hernia,GERD  ,,  Endo/Other  diabetes  Morbid obesity  Renal/GU Renal diseasenegative Renal ROS  negative genitourinary   Musculoskeletal   Abdominal   Peds  Hematology negative hematology ROS (+)   Anesthesia Other Findings   Reproductive/Obstetrics negative OB ROS                             Anesthesia Physical Anesthesia Plan  ASA: 3  Anesthesia Plan: General   Post-op Pain Management:    Induction:   PONV Risk Score and Plan: Propofol infusion  Airway Management Planned:   Additional Equipment:   Intra-op Plan:   Post-operative Plan:   Informed Consent: I have reviewed the patients History and Physical, chart, labs and discussed the procedure including the risks, benefits and alternatives for the proposed anesthesia with the patient or authorized representative who has indicated his/her understanding and acceptance.     Dental Advisory Given  Plan Discussed with: CRNA  Anesthesia Plan Comments:        Anesthesia Quick Evaluation

## 2022-09-09 NOTE — Anesthesia Procedure Notes (Addendum)
Date/Time: 09/09/2022 12:10 PM  Performed by: Julian Reil, CRNAPre-anesthesia Checklist: Emergency Drugs available, Patient identified, Suction available and Patient being monitored Patient Re-evaluated:Patient Re-evaluated prior to induction Oxygen Delivery Method: Simple face mask Induction Type: IV induction Placement Confirmation: positive ETCO2 Comments: POMs face mask used.

## 2022-09-09 NOTE — Interval H&P Note (Signed)
History and Physical Interval Note:  09/09/2022 11:10 AM  Duane Barrera  has presented today for surgery, with the diagnosis of GERD,hematemesis.  The various methods of treatment have been discussed with the patient and family. After consideration of risks, benefits and other options for treatment, the patient has consented to  Procedure(s) with comments: ESOPHAGOGASTRODUODENOSCOPY (EGD) WITH PROPOFOL (N/A) - 2:30 pm,asa 3 as a surgical intervention.  The patient's history has been reviewed, patient examined, no change in status, stable for surgery.  I have reviewed the patient's chart and labs.  Questions were answered to the patient's satisfaction.     Lanelle Bal

## 2022-09-09 NOTE — Op Note (Signed)
[facility] Patient Name: Duane Barrera Procedure Date: 09/09/2022 11:57 AM MRN: [id number] Date of Birth: 21-Mar-1971 Attending MD: Hennie Duos. Marletta Lor , [place], [id number] CSN: [id number] Age: 52 Admit Type: Outpatient Procedure:                Upper GI endoscopy Indications:              Heartburn, Nausea Providers:                Hennie Duos. Marletta Lor, DO, Jessica Boudreaux, Crystal                            Page, Zena Amos, Dyann Ruddle Referring MD:              Medicines:                See the Anesthesia note for documentation of the                            administered medications Complications:            No immediate complications. Estimated Blood Loss:     Estimated blood loss was minimal. Procedure:                Pre-Anesthesia Assessment:                           - The anesthesia plan was to use monitored                            anesthesia care (MAC).                           After obtaining informed consent, the endoscope was                            passed under direct vision. Throughout the                            procedure, the patient's blood pressure, pulse, and                            oxygen saturations were monitored continuously. The                            [id number]-H190 ([id number]) scope was introduced through the                            mouth, and advanced to the second part of duodenum.                            The upper GI endoscopy was accomplished without                            difficulty. The patient tolerated the procedure                            well. Scope In:  12:10:26 PM Scope Out: 12:13:18 PM Total Procedure Duration: 0 hours 2 minutes 52 seconds  Findings:      A 3-4 cm hiatal hernia was present.      Diffuse moderate inflammation characterized by erosions and erythema was       found in the entire examined stomach. Biopsies were taken with a cold       forceps for Helicobacter pylori testing.      The duodenal bulb,  first portion of the duodenum and second portion of       the duodenum were normal. Impression:               - 3 cm hiatal hernia.                           - Gastritis. Biopsied.                           - Normal duodenal bulb, first portion of the                            duodenum and second portion of the duodenum. Moderate Sedation:      Per Anesthesia Care Recommendation:           - Patient has a contact number available for                            emergencies. The signs and symptoms of potential                            delayed complications were discussed with the                            patient. Return to normal activities tomorrow.                            Written discharge instructions were provided to the                            patient.                           - Resume previous diet.                           - Continue present medications.                           - Await pathology results.                           - Use Protonix (pantoprazole) 40 mg PO BID.                           - No ibuprofen, naproxen, or other non-steroidal                            anti-inflammatory drugs.                           -  Consider trial of Dexilant or Voquezna if                            symptoms not improved with BID PPI Procedure Code(s):        --- Professional ---                           [phone], Esophagogastroduodenoscopy, flexible,                            transoral; with biopsy, single or multiple Diagnosis Code(s):        --- Professional ---                           K44.9, Diaphragmatic hernia without obstruction or                            gangrene                           K29.70, Gastritis, unspecified, without bleeding                           R12, Heartburn                           R11.0, Nausea CPT copyright 2022 American Medical Association. All rights reserved. The codes documented in this report are preliminary and upon coder review  may  be revised to meet current compliance requirements. Hennie Duos. Marletta Lor, DO Hennie Duos. Marletta Lor, DO 09/09/2022 12:16:38 PM This report has been signed electronically. Number of Addenda: 0

## 2022-09-09 NOTE — Discharge Instructions (Addendum)
EGD Discharge instructions Please read the instructions outlined below and refer to this sheet in the next few weeks. These discharge instructions provide you with general information on caring for yourself after you leave the hospital. Your doctor may also give you specific instructions. While your treatment has been planned according to the most current medical practices available, unavoidable complications occasionally occur. If you have any problems or questions after discharge, please call your doctor. ACTIVITY You may resume your regular activity but move at a slower pace for the next 24 hours.  Take frequent rest periods for the next 24 hours.  Walking will help expel (get rid of) the air and reduce the bloated feeling in your abdomen.  No driving for 24 hours (because of the anesthesia (medicine) used during the test).  You may shower.  Do not sign any important legal documents or operate any machinery for 24 hours (because of the anesthesia used during the test).  NUTRITION Drink plenty of fluids.  You may resume your normal diet.  Begin with a light meal and progress to your normal diet.  Avoid alcoholic beverages for 24 hours or as instructed by your caregiver.  MEDICATIONS You may resume your normal medications unless your caregiver tells you otherwise.  WHAT YOU CAN EXPECT TODAY You may experience abdominal discomfort such as a feeling of fullness or "gas" pains.  FOLLOW-UP Your doctor will discuss the results of your test with you.  SEEK IMMEDIATE MEDICAL ATTENTION IF ANY OF THE FOLLOWING OCCUR: Excessive nausea (feeling sick to your stomach) and/or vomiting.  Severe abdominal pain and distention (swelling).  Trouble swallowing.  Temperature over 101 F (37.8 C).  Rectal bleeding or vomiting of blood.   Your EGD revealed mild amount inflammation in your stomach.  I took biopsies of this to rule out infection with a bacteria called H. pylori.  Await pathology results, my  office will contact you.  You also have a hiatal hernia.   Esophagus and small bowel appeared normal.   Continue on Pantoprazole twice daily. We may trial a different medication.   Follow up in GI office in 6 weeks.    I hope you have a great rest of your week!  Duane Barrera. Marletta Lor, D.O. Gastroenterology and Hepatology [facility]

## 2022-09-10 LAB — SURGICAL PATHOLOGY

## 2022-09-14 ENCOUNTER — Encounter (HOSPITAL_COMMUNITY): Payer: Self-pay | Admitting: Internal Medicine

## 2022-09-15 ENCOUNTER — Other Ambulatory Visit (INDEPENDENT_AMBULATORY_CARE_PROVIDER_SITE_OTHER): Payer: Self-pay | Admitting: Gastroenterology

## 2022-09-15 MED ORDER — DEXLANSOPRAZOLE 60 MG PO CPDR: 60.0000 mg | DELAYED_RELEASE_CAPSULE | Freq: Every day | ORAL | 2 refills | Status: DC

## 2022-09-15 NOTE — Anesthesia Postprocedure Evaluation (Signed)
Anesthesia Post Note  Patient: Duane Barrera  Procedure(s) Performed: ESOPHAGOGASTRODUODENOSCOPY (EGD) WITH PROPOFOL BIOPSY  Patient location during evaluation: Phase II Anesthesia Type: General Level of consciousness: awake Pain management: pain level controlled Vital Signs Assessment: post-procedure vital signs reviewed and stable Respiratory status: spontaneous breathing and respiratory function stable Cardiovascular status: blood pressure returned to baseline and stable Postop Assessment: no headache and no apparent nausea or vomiting Anesthetic complications: no Comments: Late entry   No notable events documented.   Last Vitals:  Vitals:   09/09/22 1118 09/09/22 1222  BP: (!) 153/99 (!) 91/58  Pulse: (!) 101 (!) 102  Resp: 16 17  Temp: 36.9 C (!) 36.1 C  SpO2: 98% 97%    Last Pain:  Vitals:   09/09/22 1222  TempSrc: Oral  PainSc: 0-No pain                 Windell Norfolk

## 2022-09-16 ENCOUNTER — Encounter: Payer: Self-pay | Admitting: *Deleted

## 2022-09-29 ENCOUNTER — Other Ambulatory Visit (INDEPENDENT_AMBULATORY_CARE_PROVIDER_SITE_OTHER): Payer: Self-pay | Admitting: Gastroenterology

## 2022-09-29 MED ORDER — VOQUEZNA 20 MG PO TABS: 20.0000 mg | ORAL_TABLET | Freq: Every day | ORAL | 2 refills | Status: DC

## 2022-09-30 ENCOUNTER — Other Ambulatory Visit (INDEPENDENT_AMBULATORY_CARE_PROVIDER_SITE_OTHER): Payer: Self-pay | Admitting: Gastroenterology

## 2022-10-13 ENCOUNTER — Ambulatory Visit: Payer: No Typology Code available for payment source | Admitting: Gastroenterology

## 2022-10-13 ENCOUNTER — Encounter: Payer: Self-pay | Admitting: Gastroenterology

## 2022-10-13 VITALS — BP 126/89 | HR 91 | Temp 98.0°F | Ht 70.0 in | Wt 322.6 lb

## 2022-10-13 DIAGNOSIS — K219 Gastro-esophageal reflux disease without esophagitis: Secondary | ICD-10-CM | POA: Diagnosis not present

## 2022-10-13 MED ORDER — FAMOTIDINE 20 MG PO TABS: 20.0000 mg | ORAL_TABLET | Freq: Every day | ORAL | Status: DC

## 2022-10-13 MED ORDER — RABEPRAZOLE SODIUM 20 MG PO TBEC: 20.0000 mg | DELAYED_RELEASE_TABLET | Freq: Two times a day (BID) | ORAL | 5 refills | Status: DC

## 2022-10-13 NOTE — Progress Notes (Signed)
GI Office Note    Referring Provider: Gabriel Earing, FNP Primary Care Physician:  Gabriel Earing, FNP  Primary Gastroenterologist: Hennie Duos. Marletta Lor, DO   Chief Complaint   Chief Complaint  Patient presents with   Follow-up    Not any better    History of Present Illness   Duane Barrera is a 52 y.o. male presenting today for follow-up.  Last seen back in May by Brooke Bonito, NP.  History of nocturnal GERD associated with vomiting.  Also with daytime burning.  Unable to wear his appliance for sleep apnea due to reflux.  Previously had some hematemesis on several occasions.  Per PCP notes, patient previously failed omeprazole and Nexium and at time of his office visit was on pantoprazole 20 mg daily.  He was increased to pantoprazole 40 mg twice daily and Carafate added.  Completed EGD as outlined below.  He had gastritis but no H. pylori.  3 cm hiatal hernia.  After endoscopy, due to ongoing reflux symptoms, we tried to switch him to [employer] but insurance would not pay for it.  He received samples of both Voquezna but he did not feel like these were very helpful.  Currently he is taking famotidine 20 mg daily around lunchtime.  Also goes through a bottle of TUMS every 2 days.  States his daytime reflux is not as bad but as soon as he lays down at night he starts having issues.  Recalls being on Nexium for 17 years, was able to come off of it in 2016.  For period of time he did well with over-the-counter antacids.  Then he was switched to omeprazole 20 mg daily, increase to 40 mg daily, ultimately 1 twice daily.  Never controlled his symptoms.  Recently on pantoprazole, titrating up to 40 mg twice daily along with Carafate but still having significant nocturnal heartburn/regurgitation.  The days that he chooses to skip dinner, he has less issues with nocturnal symptoms.  On the days that he does eat dinner, he typically has 4 to 5 hours between eating and laying down.   His only caffeine is 1 glass of tea.  He does not smoke or consume alcohol.  He is trying to avoid spicy/greasy foods.  Trying to eat small portion sizes.  He has an adjustable bed and currently sleeping at 45 degree angle but still waking up with regurgitation regularly.  When this happens he has to try to sleep sitting upright.  States he has been unable to wear his CPAP appliance because he cannot get it out of his mouth quick enough when he starts regurgitating.  Bowel movements okay, occasional loose stools.  No melena or rectal bleeding.  Completed EGD Sep 09, 2022 showing 3 cm hiatal hernia, non-H. pylori gastritis.  Mother had surgery to remove part of her colon due to colon cancer (age 22). Has never had colonoscopy. Did Cologuard in September last year that was negative.   Medications   Current Outpatient Medications  Medication Sig Dispense Refill   Accu-Chek Softclix Lancets lancets TEST BLOOD SUGAR 4 TIMES   DAILY, 100 each 8   blood glucose meter kit and supplies Dispense based on patient and insurance preference. Use up to four times daily as directed. (FOR ICD-10 E10.9, E11.9). 1 each 0   fluticasone (FLONASE) 50 MCG/ACT nasal spray Place 2 sprays into both nostrils daily. 16 g 6   glucose blood (ACCU-CHEK GUIDE) test strip Test BS QID Dx E11.65  400 strip 3   sitaGLIPtin (JANUVIA) 25 MG tablet Take 1 tablet (25 mg total) by mouth daily. 90 tablet 3   sucralfate (CARAFATE) 1 g tablet Take 1 tablet (1 g total) by mouth 3 (three) times daily. 1080 tablet 0   tamsulosin (FLOMAX) 0.4 MG CAPS capsule TAKE 1 CAPSULE BY MOUTH EVERY DAY (Patient taking differently: Take 0.4 mg by mouth daily as needed (Kidney stone).) 90 capsule 0   No current facility-administered medications for this visit.    Allergies   Allergies as of 10/13/2022 - Review Complete 10/13/2022  Allergen Reaction Noted   Marcelline Deist [dapagliflozin] Itching 02/01/2022   Metformin and related Other (See Comments)  08/04/2022   Ozempic (0.25 or 0.5 mg-dose) [semaglutide(0.25 or 0.5mg -dos)]  05/05/2022   Phenergan [promethazine hcl] Nausea And Vomiting 04/02/2015      Review of Systems   General: Negative for anorexia, weight loss, fever, chills, fatigue, weakness. ENT: Negative for hoarseness, difficulty swallowing , nasal congestion. CV: Negative for chest pain, angina, palpitations, dyspnea on exertion, peripheral edema.  Respiratory: Negative for dyspnea at rest, dyspnea on exertion, cough, sputum, wheezing.  GI: See history of present illness. GU:  Negative for dysuria, hematuria, urinary incontinence, urinary frequency, nocturnal urination.  Endo: Negative for unusual weight change.     Physical Exam   BP 126/89 (BP Location: Right Arm, Patient Position: Sitting, Cuff Size: Large)   Pulse 91   Temp 98 F (36.7 C) (Oral)   Ht 5\' 10"  (1.778 m)   Wt (!) 322 lb 9.6 oz (146.3 kg)   SpO2 97%   BMI 46.29 kg/m    General: Well-nourished, well-developed in no acute distress.  Eyes: No icterus. Mouth: Oropharyngeal mucosa moist and pink   Abdomen: Bowel sounds are normal, nontender, nondistended, no hepatosplenomegaly or masses,  no abdominal bruits or hernia , no rebound or guarding.  Rectal: not performed Extremities: No lower extremity edema. No clubbing or deformities. Neuro: Alert and oriented x 4   Skin: Warm and dry, no jaundice.   Psych: Alert and cooperative, normal mood and affect.  Labs   Lab Results  Component Value Date   HGBA1C 6.9 (H) 08/04/2022    Imaging Studies   No results found.  Assessment   GERD: Daytime symptoms more manageable.  Continues to have significant nocturnal symptoms with regurgitation and sometimes low-volume hematemesis.  Has been on multiple different PPIs at maximum dose.  Has noted no significant improvement in regurgitation.  Does better if he does not eat supper.  Eating supper typically no later than 5 PM and lays down between 9 and 10 PM.   Bed elevated 45 degrees, adjustable bed.  Recent EGD with gastritis and 3 cm hiatal hernia.   PLAN   Start rabeprazole 20mg  bid before meals. Take famotidine 20mg  at bedtime Try Reflux Gourmet 1 teaspoon before bed.  Progress report in 2-3 weeks.    Leanna Battles. Melvyn Neth, MHS, PA-C [facility]

## 2022-10-13 NOTE — Patient Instructions (Signed)
Start rabeprazole 20mg  for breakfast and before supper. Take famotidine 20 mg at bedtime. Try Reflux Gourmet 1 teaspoon before bed. You can purchase on [employer] or at [email or web address]

## 2022-10-17 ENCOUNTER — Other Ambulatory Visit: Payer: Self-pay | Admitting: Family Medicine

## 2022-10-17 DIAGNOSIS — N401 Enlarged prostate with lower urinary tract symptoms: Secondary | ICD-10-CM

## 2022-10-25 DIAGNOSIS — K219 Gastro-esophageal reflux disease without esophagitis: Secondary | ICD-10-CM

## 2022-10-29 ENCOUNTER — Other Ambulatory Visit: Payer: Self-pay | Admitting: Gastroenterology

## 2022-10-29 MED ORDER — DEXLANSOPRAZOLE 60 MG PO CPDR: 60.0000 mg | DELAYED_RELEASE_CAPSULE | Freq: Every day | ORAL | 11 refills | Status: DC

## 2022-10-29 NOTE — Addendum Note (Signed)
Addended by: Tiffany Kocher on: 10/29/2022 06:52 AM   Modules accepted: Orders

## 2022-10-29 NOTE — Telephone Encounter (Signed)
I have sent in RX for Dexilant again to see if we can get approved. He has failed every PPI but has not tried [employer].

## 2022-10-29 NOTE — Telephone Encounter (Signed)
Cannot get into Cover My Meds today

## 2022-11-03 ENCOUNTER — Ambulatory Visit (INDEPENDENT_AMBULATORY_CARE_PROVIDER_SITE_OTHER): Payer: No Typology Code available for payment source | Admitting: Family Medicine

## 2022-11-03 ENCOUNTER — Encounter: Payer: Self-pay | Admitting: Family Medicine

## 2022-11-03 VITALS — BP 125/89 | HR 94 | Temp 98.1°F | Resp 20 | Ht 70.0 in | Wt 323.0 lb

## 2022-11-03 DIAGNOSIS — K219 Gastro-esophageal reflux disease without esophagitis: Secondary | ICD-10-CM | POA: Diagnosis not present

## 2022-11-03 DIAGNOSIS — Z0000 Encounter for general adult medical examination without abnormal findings: Secondary | ICD-10-CM

## 2022-11-03 DIAGNOSIS — Z0001 Encounter for general adult medical examination with abnormal findings: Secondary | ICD-10-CM

## 2022-11-03 DIAGNOSIS — E1165 Type 2 diabetes mellitus with hyperglycemia: Secondary | ICD-10-CM | POA: Diagnosis not present

## 2022-11-03 LAB — BAYER DCA HB A1C WAIVED: HB A1C (BAYER DCA - WAIVED): 6.7 % — ABNORMAL HIGH (ref 4.8–5.6)

## 2022-11-03 NOTE — Progress Notes (Signed)
Complete physical exam  Patient: Duane Barrera   DOB: [birth year]   52 y.o. Male  MRN: [id number]  Subjective:    Chief Complaint  Patient presents with   Annual Exam    Duane Barrera is a 52 y.o. male who presents today for a complete physical exam. He reports consuming a general diet. He has been walking about 20 minutes every other night. He generally feels fairly well. He reports sleeping poorly to due uncontrolled GERD. He does not have additional problems to discuss today.   Seeing GI for uncontrolled GERD. Still having regurgitation at night. Working on Engineer, drilling approved to try as he has failed all the other PPIs. Also on pepcid. Has follow up in a few weeks.   Blood sugars fasting 130-140 range, 130s post prandial. Denies hypoglycemia. Previously stopped glipizide due to hypoglycemia.   Most recent fall risk assessment:    11/03/2022    4:06 PM  Fall Risk   Falls in the past year? 0     Most recent depression screenings:    11/03/2022    4:06 PM 08/04/2022    7:57 AM  PHQ 2/9 Scores  PHQ - 2 Score 0 0  PHQ- 9 Score 4 6    Vision:Within last year and Dental: No current dental problems and Receives regular dental care  Past Medical History:  Diagnosis Date   Arthritis    both knees and elbow   Chronic kidney disease    h/o kidneys 2003-04   Complication of anesthesia    GERD (gastroesophageal reflux disease)    otc tums   Headache    h/o migraines   History of hiatal hernia    History of kidney stones    PONV (postoperative nausea and vomiting)    Sleep apnea       Patient Care Team: Gabriel Earing, FNP as PCP - General (Family Medicine)   Outpatient Medications Prior to Visit  Medication Sig   Accu-Chek Softclix Lancets lancets TEST BLOOD SUGAR 4 TIMES   DAILY,   blood glucose meter kit and supplies Dispense based on patient and insurance preference. Use up to four times daily as directed. (FOR ICD-10 E10.9, E11.9).   famotidine  (PEPCID) 20 MG tablet Take 1 tablet (20 mg total) by mouth at bedtime.   fluticasone (FLONASE) 50 MCG/ACT nasal spray Place 2 sprays into both nostrils daily.   glucose blood (ACCU-CHEK GUIDE) test strip Test BS QID Dx E11.65   sitaGLIPtin (JANUVIA) 25 MG tablet Take 1 tablet (25 mg total) by mouth daily.   sucralfate (CARAFATE) 1 g tablet Take 1 tablet (1 g total) by mouth 3 (three) times daily.   tamsulosin (FLOMAX) 0.4 MG CAPS capsule TAKE 1 CAPSULE BY MOUTH EVERY DAY   dexlansoprazole (DEXILANT) 60 MG capsule Take 1 capsule (60 mg total) by mouth daily before breakfast. (Patient not taking: Reported on 11/03/2022)   No facility-administered medications prior to visit.    ROS As per HPI.      Objective:     BP 125/89   Pulse 94   Temp 98.1 F (36.7 C) (Temporal)   Resp 20   Ht 5\' 10"  (1.778 m)   Wt (!) 323 lb (146.5 kg)   SpO2 96%   BMI 46.35 kg/m    Physical Exam Vitals and nursing note reviewed.  Constitutional:      General: He is not in acute distress.    Appearance: He is obese. He  is not ill-appearing, toxic-appearing or diaphoretic.  HENT:     Head: Normocephalic.     Right Ear: Tympanic membrane, ear canal and external ear normal.     Left Ear: Tympanic membrane, ear canal and external ear normal.     Nose: Nose normal.     Mouth/Throat:     Mouth: Mucous membranes are moist.     Pharynx: Oropharynx is clear.  Eyes:     Extraocular Movements: Extraocular movements intact.     Conjunctiva/sclera: Conjunctivae normal.     Pupils: Pupils are equal, round, and reactive to light.  Neck:     Thyroid: No thyroid mass, thyromegaly or thyroid tenderness.  Cardiovascular:     Rate and Rhythm: Normal rate and regular rhythm.     Pulses: Normal pulses.     Heart sounds: Normal heart sounds. No murmur heard.    No friction rub. No gallop.  Pulmonary:     Effort: Pulmonary effort is normal.     Breath sounds: Normal breath sounds.  Abdominal:     General: Bowel  sounds are normal. There is no distension.     Palpations: Abdomen is soft. There is no mass.     Tenderness: There is no abdominal tenderness. There is no guarding.  Musculoskeletal:     Cervical back: Neck supple. No rigidity or tenderness.     Right lower leg: No edema.     Left lower leg: No edema.  Skin:    General: Skin is warm and dry.     Capillary Refill: Capillary refill takes less than 2 seconds.     Findings: No lesion or rash.  Neurological:     General: No focal deficit present.     Mental Status: He is alert and oriented to person, place, and time.     Cranial Nerves: No cranial nerve deficit.     Motor: No weakness.     Coordination: Coordination normal.     Gait: Gait normal.  Psychiatric:        Mood and Affect: Mood normal.        Behavior: Behavior normal.        Thought Content: Thought content normal.      No results found for any visits on 11/03/22.     Assessment & Plan:    Routine Health Maintenance and Physical Exam  Duane Barrera was seen today for annual exam.  Diagnoses and all orders for this visit:  Routine general medical examination at a health care facility  Type 2 diabetes mellitus with hyperglycemia, without long-term current use of insulin (HCC) A1c 6.8 today, at goal of <7. Medication changes today: none. Continue current regimen. He has declined ACE/ARB and statin. Eye exam: UTD. Foot exam: UTD. Urine micro: UTD. Diet and exercise.  -     Bayer DCA Hb A1c Waived -     CBC with Differential/Platelet -     CMP14+EGFR -     Lipid panel -     Microalbumin / creatinine urine ratio  Morbid obesity (HCC) Diet and exercise. Labs pending.  -     TSH  Gastroesophageal reflux disease without esophagitis Managed by GI. Uncontrolled. Keep follow up appt with GI.    Immunization History  Administered Date(s) Administered   Influenza Split 01/23/2010, 02/09/2012, 01/31/2013   Influenza,inj,Quad PF,6+ Mos 12/22/2018   Influenza-Unspecified  01/29/2017, 11/24/2017, 12/20/2018   PFIZER(Purple Top)SARS-COV-2 Vaccination 07/05/2019   Tdap 04/22/2016   Zoster Recombinant(Shingrix) 11/12/2020, 05/08/2021  Health Maintenance  Topic Date Due   COVID-19 Vaccine (2 - 2023-24 season) 02/18/2023 (Originally 12/11/2021)   INFLUENZA VACCINE  11/11/2022   OPHTHALMOLOGY EXAM  01/02/2023   Diabetic kidney evaluation - Urine ACR  02/02/2023   HEMOGLOBIN A1C  02/03/2023   FOOT EXAM  05/06/2023   Diabetic kidney evaluation - eGFR measurement  09/08/2023   Fecal DNA (Cologuard)  12/12/2023   DTaP/Tdap/Td (2 - Td or Tdap) 04/22/2026   Hepatitis C Screening  Completed   HIV Screening  Completed   Zoster Vaccines- Shingrix  Completed   HPV VACCINES  Aged Out    Discussed health benefits of physical activity, and encouraged him to engage in regular exercise appropriate for his age and condition.  Problem List Items Addressed This Visit       Digestive   Gastroesophageal reflux disease without esophagitis     Endocrine   Type 2 diabetes mellitus with hyperglycemia, without long-term current use of insulin (HCC)   Relevant Orders   Bayer DCA Hb A1c Waived   CBC with Differential/Platelet   CMP14+EGFR   Lipid panel   Microalbumin / creatinine urine ratio     Other   Morbid obesity (HCC)   Relevant Orders   TSH   Other Visit Diagnoses     Routine general medical examination at a health care facility    -  Primary      Return in about 6 months (around 05/06/2023).   The patient indicates understanding of these issues and agrees with the plan.  Gabriel Earing, FNP

## 2022-11-03 NOTE — Patient Instructions (Signed)
Health Maintenance, Male Adopting a healthy lifestyle and getting preventive care are important in promoting health and wellness. Ask your health care provider about: The right schedule for you to have regular tests and exams. Things you can do on your own to prevent diseases and keep yourself healthy. What should I know about diet, weight, and exercise? Eat a healthy diet  Eat a diet that includes plenty of vegetables, fruits, low-fat dairy products, and lean protein. Do not eat a lot of foods that are high in solid fats, added sugars, or sodium. Maintain a healthy weight Body mass index (BMI) is a measurement that can be used to identify possible weight problems. It estimates body fat based on height and weight. Your health care provider can help determine your BMI and help you achieve or maintain a healthy weight. Get regular exercise Get regular exercise. This is one of the most important things you can do for your health. Most adults should: Exercise for at least 150 minutes each week. The exercise should increase your heart rate and make you sweat (moderate-intensity exercise). Do strengthening exercises at least twice a week. This is in addition to the moderate-intensity exercise. Spend less time sitting. Even light physical activity can be beneficial. Watch cholesterol and blood lipids Have your blood tested for lipids and cholesterol at 53 years of age, then have this test every 5 years. You may need to have your cholesterol levels checked more often if: Your lipid or cholesterol levels are high. You are older than 53 years of age. You are at high risk for heart disease. What should I know about cancer screening? Many types of cancers can be detected early and may often be prevented. Depending on your health history and family history, you may need to have cancer screening at various ages. This may include screening for: Colorectal cancer. Prostate cancer. Skin cancer. Lung  cancer. What should I know about heart disease, diabetes, and high blood pressure? Blood pressure and heart disease High blood pressure causes heart disease and increases the risk of stroke. This is more likely to develop in people who have high blood pressure readings or are overweight. Talk with your health care provider about your target blood pressure readings. Have your blood pressure checked: Every 3-5 years if you are 18-39 years of age. Every year if you are 40 years old or older. If you are between the ages of 65 and 75 and are a current or former smoker, ask your health care provider if you should have a one-time screening for abdominal aortic aneurysm (AAA). Diabetes Have regular diabetes screenings. This checks your fasting blood sugar level. Have the screening done: Once every three years after age 45 if you are at a normal weight and have a low risk for diabetes. More often and at a younger age if you are overweight or have a high risk for diabetes. What should I know about preventing infection? Hepatitis B If you have a higher risk for hepatitis B, you should be screened for this virus. Talk with your health care provider to find out if you are at risk for hepatitis B infection. Hepatitis C Blood testing is recommended for: Everyone born from 1945 through 1965. Anyone with known risk factors for hepatitis C. Sexually transmitted infections (STIs) You should be screened each year for STIs, including gonorrhea and chlamydia, if: You are sexually active and are younger than 53 years of age. You are older than 53 years of age and your   health care provider tells you that you are at risk for this type of infection. Your sexual activity has changed since you were last screened, and you are at increased risk for chlamydia or gonorrhea. Ask your health care provider if you are at risk. Ask your health care provider about whether you are at high risk for HIV. Your health care provider  may recommend a prescription medicine to help prevent HIV infection. If you choose to take medicine to prevent HIV, you should first get tested for HIV. You should then be tested every 3 months for as long as you are taking the medicine. Follow these instructions at home: Alcohol use Do not drink alcohol if your health care provider tells you not to drink. If you drink alcohol: Limit how much you have to 0-2 drinks a day. Know how much alcohol is in your drink. In the [place] one drink equals one 12 oz bottle of beer (355 mL), one 5 oz glass of wine (148 mL), or one 1 oz glass of hard liquor (44 mL). Lifestyle Do not use any products that contain nicotine or tobacco. These products include cigarettes, chewing tobacco, and vaping devices, such as e-cigarettes. If you need help quitting, ask your health care provider. Do not use street drugs. Do not share needles. Ask your health care provider for help if you need support or information about quitting drugs. General instructions Schedule regular health, dental, and eye exams. Stay current with your vaccines. Tell your health care provider if: You often feel depressed. You have ever been abused or do not feel safe at home. Summary Adopting a healthy lifestyle and getting preventive care are important in promoting health and wellness. Follow your health care provider's instructions about healthy diet, exercising, and getting tested or screened for diseases. Follow your health care provider's instructions on monitoring your cholesterol and blood pressure. This information is not intended to replace advice given to you by your health care provider. Make sure you discuss any questions you have with your health care provider. Document Revised: 08/18/2020 Document Reviewed: 08/18/2020 Elsevier Patient Education  2024 Elsevier Inc.  

## 2022-11-04 LAB — CBC WITH DIFFERENTIAL/PLATELET
Basophils Absolute: 0.1 10*3/uL (ref 0.0–0.2)
Basos: 1 %
EOS (ABSOLUTE): 0.3 10*3/uL (ref 0.0–0.4)
Eos: 3 %
Hemoglobin: 15.6 g/dL (ref 13.0–17.7)
Immature Grans (Abs): 0 10*3/uL (ref 0.0–0.1)
Immature Granulocytes: 0 %
Lymphocytes Absolute: 3.1 10*3/uL (ref 0.7–3.1)
Lymphs: 39 %
MCHC: 33.2 g/dL (ref 31.5–35.7)
MCV: 90 fL (ref 79–97)
Monocytes Absolute: 0.6 10*3/uL (ref 0.1–0.9)
Monocytes: 8 %
Neutrophils Absolute: 3.8 10*3/uL (ref 1.4–7.0)
Neutrophils: 49 %
Platelets: 224 10*3/uL (ref 150–450)
RBC: 5.23 x10E6/uL (ref 4.14–5.80)
RDW: 12.5 % (ref 11.6–15.4)
WBC: 7.9 10*3/uL (ref 3.4–10.8)

## 2022-11-04 LAB — LIPID PANEL
Chol/HDL Ratio: 4 ratio (ref 0.0–5.0)
Cholesterol, Total: 148 mg/dL (ref 100–199)
HDL: 37 mg/dL — ABNORMAL LOW (ref >39)
LDL Chol Calc (NIH): 58 mg/dL (ref 0–99)
Triglycerides: 345 mg/dL — ABNORMAL HIGH (ref 0–149)
VLDL Cholesterol Cal: 53 mg/dL — ABNORMAL HIGH (ref 5–40)

## 2022-11-04 LAB — CMP14+EGFR
ALT: 25 IU/L (ref 0–44)
AST: 25 IU/L (ref 0–40)
Albumin: 4.1 g/dL (ref 3.8–4.9)
Alkaline Phosphatase: 89 IU/L (ref 44–121)
BUN: 12 mg/dL (ref 6–24)
Bilirubin Total: 0.4 mg/dL (ref 0.0–1.2)
CO2: 24 mmol/L (ref 20–29)
Calcium: 9.2 mg/dL (ref 8.7–10.2)
Chloride: 101 mmol/L (ref 96–106)
Globulin, Total: 3.3 g/dL (ref 1.5–4.5)
Potassium: 4.2 mmol/L (ref 3.5–5.2)
Total Protein: 7.4 g/dL (ref 6.0–8.5)
eGFR: 78 mL/min/{1.73_m2} (ref >59)

## 2022-11-04 LAB — SPECIMEN STATUS REPORT

## 2022-11-04 LAB — TSH

## 2022-11-04 LAB — MICROALBUMIN / CREATININE URINE RATIO
Creatinine, Urine: 131.2 mg/dL
Microalb/Creat Ratio: 3 mg/g creat (ref 0–29)
Microalbumin, Urine: 3.7 ug/mL

## 2022-11-08 ENCOUNTER — Other Ambulatory Visit: Payer: Self-pay | Admitting: Family Medicine

## 2022-11-08 ENCOUNTER — Telehealth: Payer: Self-pay

## 2022-11-08 DIAGNOSIS — E1165 Type 2 diabetes mellitus with hyperglycemia: Secondary | ICD-10-CM

## 2022-11-08 MED ORDER — ZITUVIO 50 MG PO TABS
50.0000 mg | ORAL_TABLET | Freq: Every day | ORAL | 3 refills | Status: DC
Start: 2022-11-08 — End: 2022-11-08

## 2022-11-08 NOTE — Telephone Encounter (Signed)
saxagliptin HCl (ONGLYZA) 2.5 MG TABS tablet        Changed from: ZITUVIO 50 MG TABS    Pharmacy comment: Alternative Requested:NON FORMULARY DRUG; PLEASE CONTACT INSURANCE OR CONSIDER ALTERNATIVE.   All Pharmacy Suggested Alternatives:  saxagliptin HCl (ONGLYZA) 2.5 MG TABS tablet sitaGLIPtin (JANUVIA) 50 MG tablet

## 2022-11-08 NOTE — Telephone Encounter (Signed)
Patient's insurance will not cover Januvia.  Covered alternatives are:  Glimepiride Glipizide Glipizide ER Glipizide-Metformin Metformin ER Pioglitazone Zituvio

## 2022-11-08 NOTE — Telephone Encounter (Signed)
Zituvio ordered.

## 2022-11-08 NOTE — Telephone Encounter (Signed)
THERAPY CHANGE.

## 2022-11-09 ENCOUNTER — Telehealth: Payer: Self-pay

## 2022-11-09 NOTE — Telephone Encounter (Signed)
Duane Barrera (Key: [id number]) PA Case ID #: [id number] Rx #: [id number] Need Help? Call us at [phone] Outcome Approved today by [facility] NCPDP 2017 Your PA request has been approved. Additional information will be provided in the approval communication. (Message 1145) Authorization Expiration Date: 11/08/2025 Drug sAXagliptin HCl 2.5MG  tablets ePA cloud logo Form [employer] Electronic PA Form [phone] NCPDP) Original Claim Info [id number] MUST MEET STEP OR MD CALL [phone] THERAPY IS REQUIREDDRUG REQUIRES PRIOR AUTHORIZATION(PHARMACY HELP DESK [phone])   Pharmacy aware

## 2022-12-02 NOTE — Telephone Encounter (Signed)
Please schedule ph/impedence study on Dexilant. [facility], [place] or [facility].   Please set follow up ov with Dr. Marletta Lor only to follow study.

## 2022-12-03 ENCOUNTER — Other Ambulatory Visit (HOSPITAL_COMMUNITY): Payer: Self-pay

## 2022-12-03 NOTE — Addendum Note (Signed)
Addended by: Armstead Peaks on: 12/03/2022 08:15 AM   Modules accepted: Orders

## 2022-12-03 NOTE — Telephone Encounter (Addendum)
Referral sent to [facility].  Sending to front for appt once patient is scheduled for test to follow up

## 2022-12-06 ENCOUNTER — Other Ambulatory Visit: Payer: Self-pay | Admitting: Family

## 2022-12-22 ENCOUNTER — Ambulatory Visit: Payer: No Typology Code available for payment source | Admitting: Family Medicine

## 2022-12-22 ENCOUNTER — Encounter: Payer: Self-pay | Admitting: Family Medicine

## 2022-12-22 VITALS — BP 109/74 | HR 86 | Temp 98.3°F | Ht 70.0 in | Wt 320.0 lb

## 2022-12-22 DIAGNOSIS — R051 Acute cough: Secondary | ICD-10-CM

## 2022-12-22 DIAGNOSIS — J029 Acute pharyngitis, unspecified: Secondary | ICD-10-CM

## 2022-12-22 DIAGNOSIS — R0981 Nasal congestion: Secondary | ICD-10-CM

## 2022-12-22 MED ORDER — AMOXICILLIN-POT CLAVULANATE 875-125 MG PO TABS
1.0000 | ORAL_TABLET | Freq: Two times a day (BID) | ORAL | 0 refills | Status: DC
Start: 2022-12-22 — End: 2023-03-28

## 2022-12-22 NOTE — Progress Notes (Signed)
Subjective:  Patient ID: Duane Barrera, male    DOB: [birth year], 52 y.o.   MRN: [id number]  Patient Care Team: Gabriel Earing, FNP as PCP - General (Family Medicine)   Chief Complaint:  cold/flu like symptoms (X 1 week/Sore throat cough/Congestion (chest)/Productive cough - yellowish phlegm/Negative for fever)  HPI: Chirstopher Barrera is a 52 y.o. male presenting on 12/22/2022 for cold/flu like symptoms (X 1 week/Sore throat cough/Congestion (chest)/Productive cough - yellowish phlegm/Negative for fever)  HPI Patient presents for URI symptoms  Reports cough, especially at night, sore throat, chest congestion.  Cough is productive. Reports that it is light yellow and blood tinged  Reports that it started over a week ago. This weekend ~4 days ago, had chills and body aches.  Denies fever.  States that he has sinus pressure behind his eyes and above his cheeks  Reports a history of sinus infections 3 times per year  Does not have nasal congestion.  Denies sick contacts, works from home Has not taken anything OTC  Takes flonase daily for allergies   Relevant past medical, surgical, family, and social history reviewed and updated as indicated.  Allergies and medications reviewed and updated. Data reviewed: Chart in Epic.   Past Medical History:  Diagnosis Date   Arthritis    both knees and elbow   Chronic kidney disease    h/o kidneys 2003-04   Complication of anesthesia    GERD (gastroesophageal reflux disease)    otc tums   Headache    h/o migraines   History of hiatal hernia    History of kidney stones    PONV (postoperative nausea and vomiting)    Sleep apnea     Past Surgical History:  Procedure Laterality Date   BIOPSY  09/09/2022   Procedure: BIOPSY;  Surgeon: Lanelle Bal, DO;  Location: [facility];  Service: Endoscopy;;   CHOLECYSTECTOMY     ELBOW ARTHROSCOPY     4 pins in elbow   ESOPHAGOGASTRODUODENOSCOPY (EGD) WITH PROPOFOL N/A 09/09/2022    Procedure: ESOPHAGOGASTRODUODENOSCOPY (EGD) WITH PROPOFOL;  Surgeon: Lanelle Bal, DO;  Location: [facility];  Service: Endoscopy;  Laterality: N/A;  2:30 pm,asa 3   left meniscus repair     MENISCUS REPAIR     right leg   SHOULDER ARTHROSCOPY     removed distal end and cleaned out "junk" per Dr. Eulah Pont   TOTAL KNEE ARTHROPLASTY Right 04/16/2015   Procedure: RIGHT TOTAL KNEE ARTHROPLASTY;  Surgeon: Loreta Ave, MD;  Location: [facility];  Service: Orthopedics;  Laterality: Right;   WISDOM TOOTH EXTRACTION      Social History   Socioeconomic History   Marital status: Married    Spouse name: Rayfield Citizen   Number of children: 5   Years of education: 12   Highest education level: Some college, no degree  Occupational History   Not on file  Tobacco Use   Smoking status: Never   Smokeless tobacco: Never  Vaping Use   Vaping status: Never Used  Substance and Sexual Activity   Alcohol use: Not Currently    Comment: infrequent   Drug use: No   Sexual activity: Not on file  Other Topics Concern   Not on file  Social History Narrative   Not on file   Social Determinants of Health   Financial Resource Strain: Low Risk  (08/04/2022)   Overall Financial Resource Strain (CARDIA)    Difficulty of Paying Living Expenses:  Not hard at all  Food Insecurity: No Food Insecurity (08/04/2022)   Hunger Vital Sign    Worried About Running Out of Food in the Last Year: Never true    Ran Out of Food in the Last Year: Never true  Transportation Needs: No Transportation Needs (08/04/2022)   PRAPARE - Administrator, Civil Service (Medical): No    Lack of Transportation (Non-Medical): No  Physical Activity: Unknown (08/04/2022)   Exercise Vital Sign    Days of Exercise per Week: 0 days    Minutes of Exercise per Session: Not on file  Stress: No Stress Concern Present (08/04/2022)   [employer] of Occupational Health - Occupational Stress Questionnaire    Feeling of Stress :  Not at all  Social Connections: Unknown (08/04/2022)   Social Connection and Isolation Panel [NHANES]    Frequency of Communication with Friends and Family: More than three times a week    Frequency of Social Gatherings with Friends and Family: More than three times a week    Attends Religious Services: Patient declined    Active Member of Clubs or Organizations: No    Attends Banker Meetings: Not on file    Marital Status: Married  Intimate Partner Violence: Not on file    Outpatient Encounter Medications as of 12/22/2022  Medication Sig   Accu-Chek Softclix Lancets lancets TEST BLOOD SUGAR 4 TIMES   DAILY,   blood glucose meter kit and supplies Dispense based on patient and insurance preference. Use up to four times daily as directed. (FOR ICD-10 E10.9, E11.9).   dexlansoprazole (DEXILANT) 60 MG capsule Take 1 capsule (60 mg total) by mouth daily before breakfast.   famotidine (PEPCID) 20 MG tablet Take 1 tablet (20 mg total) by mouth at bedtime.   fluticasone (FLONASE) 50 MCG/ACT nasal spray SPRAY 2 SPRAYS INTO EACH NOSTRIL EVERY DAY   glucose blood (ACCU-CHEK GUIDE) test strip Test BS QID Dx E11.65   saxagliptin HCl (ONGLYZA) 2.5 MG TABS tablet Take 1 tablet (2.5 mg total) by mouth daily.   sucralfate (CARAFATE) 1 g tablet Take 1 tablet (1 g total) by mouth 3 (three) times daily.   tamsulosin (FLOMAX) 0.4 MG CAPS capsule TAKE 1 CAPSULE BY MOUTH EVERY DAY   No facility-administered encounter medications on file as of 12/22/2022.    Allergies  Allergen Reactions   Farxiga [Dapagliflozin] Itching   Metformin And Related Other (See Comments)    GI issues, fatigue   Ozempic (0.25 Or 0.5 Mg-Dose) [Semaglutide(0.25 Or 0.5mg -Dos)]     GERD, abdominal pain, constipation   Phenergan [Promethazine Hcl] Nausea And Vomiting    Review of Systems  Constitutional:  Positive for chills.  HENT:  Positive for congestion, sinus pressure, sinus pain and sore throat.    Musculoskeletal:  Positive for myalgias. Negative for joint swelling.    Objective:  BP 109/74   Pulse 86   Temp 98.3 F (36.8 C)   Ht 5\' 10"  (1.778 m)   Wt (!) 320 lb (145.2 kg)   SpO2 98%   BMI 45.92 kg/m    Wt Readings from Last 3 Encounters:  12/22/22 (!) 320 lb (145.2 kg)  11/03/22 (!) 323 lb (146.5 kg)  10/13/22 (!) 322 lb 9.6 oz (146.3 kg)   Physical Exam Constitutional:      General: He is awake. He is not in acute distress.    Appearance: Normal appearance. He is well-developed and well-groomed. He is morbidly obese. He is  not ill-appearing, toxic-appearing or diaphoretic.  HENT:     Right Ear: No laceration, drainage, swelling or tenderness. No middle ear effusion. There is no impacted cerumen. No foreign body. No mastoid tenderness. No PE tube. No hemotympanum. Tympanic membrane is injected and erythematous. Tympanic membrane is not scarred, perforated, retracted or bulging.     Left Ear: No laceration, drainage, swelling or tenderness.  No middle ear effusion. There is no impacted cerumen. No foreign body. No mastoid tenderness. No PE tube. No hemotympanum. Tympanic membrane is injected and erythematous. Tympanic membrane is not scarred, perforated, retracted or bulging.     Nose:     Right Sinus: Maxillary sinus tenderness present. No frontal sinus tenderness.     Left Sinus: Maxillary sinus tenderness present. No frontal sinus tenderness.     Mouth/Throat:     Lips: Pink.     Mouth: Mucous membranes are moist.     Palate: No mass.     Pharynx: Pharyngeal swelling and posterior oropharyngeal erythema present. No oropharyngeal exudate or uvula swelling.     Tonsils: 3+ on the right. 3+ on the left.  Cardiovascular:     Rate and Rhythm: Normal rate.     Pulses: Normal pulses.          Radial pulses are 2+ on the right side and 2+ on the left side.       Posterior tibial pulses are 2+ on the right side and 2+ on the left side.     Heart sounds: Normal heart sounds.  No murmur heard.    No gallop.  Pulmonary:     Effort: Pulmonary effort is normal. No respiratory distress.     Breath sounds: Normal breath sounds. No stridor. No wheezing, rhonchi or rales.  Musculoskeletal:     Cervical back: Full passive range of motion without pain and neck supple.     Right lower leg: No edema.     Left lower leg: No edema.  Lymphadenopathy:     Head:     Right side of head: No submental, submandibular, tonsillar, preauricular or posterior auricular adenopathy.     Left side of head: No submental, submandibular, tonsillar, preauricular or posterior auricular adenopathy.     Cervical:     Right cervical: No superficial, deep or posterior cervical adenopathy.    Left cervical: No superficial, deep or posterior cervical adenopathy.  Skin:    General: Skin is warm.     Capillary Refill: Capillary refill takes less than 2 seconds.  Neurological:     General: No focal deficit present.     Mental Status: He is alert, oriented to person, place, and time and easily aroused. Mental status is at baseline.     GCS: GCS eye subscore is 4. GCS verbal subscore is 5. GCS motor subscore is 6.     Motor: No weakness.  Psychiatric:        Attention and Perception: Attention and perception normal.        Mood and Affect: Mood and affect normal.        Speech: Speech normal.        Behavior: Behavior normal. Behavior is cooperative.        Thought Content: Thought content normal. Thought content does not include homicidal or suicidal ideation. Thought content does not include homicidal or suicidal plan.        Cognition and Memory: Cognition and memory normal.        Judgment: Judgment normal.  Results for orders placed or performed in visit on 11/03/22  Bayer DCA Hb A1c Waived  Result Value Ref Range   HB A1C (BAYER DCA - WAIVED) 6.7 (H) 4.8 - 5.6 %  CBC with Differential/Platelet  Result Value Ref Range   WBC 7.9 3.4 - 10.8 x10E3/uL   RBC 5.23 4.14 - 5.80 x10E6/uL    Hemoglobin 15.6 13.0 - 17.7 g/dL   Hematocrit 16.1 09.6 - 51.0 %   MCV 90 79 - 97 fL   MCH 29.8 26.6 - 33.0 pg   MCHC 33.2 31.5 - 35.7 g/dL   RDW 04.5 40.9 - 81.1 %   Platelets 224 150 - 450 x10E3/uL   Neutrophils 49 Not Estab. %   Lymphs 39 Not Estab. %   Monocytes 8 Not Estab. %   Eos 3 Not Estab. %   Basos 1 Not Estab. %   Neutrophils Absolute 3.8 1.4 - 7.0 x10E3/uL   Lymphocytes Absolute 3.1 0.7 - 3.1 x10E3/uL   Monocytes Absolute 0.6 0.1 - 0.9 x10E3/uL   EOS (ABSOLUTE) 0.3 0.0 - 0.4 x10E3/uL   Basophils Absolute 0.1 0.0 - 0.2 x10E3/uL   Immature Granulocytes 0 Not Estab. %   Immature Grans (Abs) 0.0 0.0 - 0.1 x10E3/uL  CMP14+EGFR  Result Value Ref Range   Glucose 157 (H) 70 - 99 mg/dL   BUN 12 6 - 24 mg/dL   Creatinine, Ser 9.14 0.76 - 1.27 mg/dL   eGFR 78 >78 GN/FAO/1.30   BUN/Creatinine Ratio 11 9 - 20   Sodium 141 134 - 144 mmol/L   Potassium 4.2 3.5 - 5.2 mmol/L   Chloride 101 96 - 106 mmol/L   CO2 24 20 - 29 mmol/L   Calcium 9.2 8.7 - 10.2 mg/dL   Total Protein 7.4 6.0 - 8.5 g/dL   Albumin 4.1 3.8 - 4.9 g/dL   Globulin, Total 3.3 1.5 - 4.5 g/dL   Bilirubin Total 0.4 0.0 - 1.2 mg/dL   Alkaline Phosphatase 89 44 - 121 IU/L   AST 25 0 - 40 IU/L   ALT 25 0 - 44 IU/L  Lipid panel  Result Value Ref Range   Cholesterol, Total 148 100 - 199 mg/dL   Triglycerides 865 (H) 0 - 149 mg/dL   HDL 37 (L) >78 mg/dL   VLDL Cholesterol Cal 53 (H) 5 - 40 mg/dL   LDL Chol Calc (NIH) 58 0 - 99 mg/dL   Chol/HDL Ratio 4.0 0.0 - 5.0 ratio  Microalbumin / creatinine urine ratio  Result Value Ref Range   Creatinine, Urine 131.2 Not Estab. mg/dL   Microalbumin, Urine 3.7 Not Estab. ug/mL   Microalb/Creat Ratio 3 0 - 29 mg/g creat  TSH  Result Value Ref Range   TSH 1.140 0.450 - 4.500 uIU/mL  Specimen status report  Result Value Ref Range   specimen status report Comment        11/03/2022    4:06 PM 08/04/2022    7:57 AM 05/05/2022    7:59 AM 02/01/2022    7:55 AM 12/07/2021     4:14 PM  Depression screen PHQ 2/9  Decreased Interest 0 0 0 0 0  Down, Depressed, Hopeless 0 0 0 0 0  PHQ - 2 Score 0 0 0 0 0  Altered sleeping 2 3 0 1 2  Tired, decreased energy 2 3 0 1 2  Change in appetite 0 0 0 0 0  Feeling bad or failure about yourself  0 0 0  0 0  Trouble concentrating 0 0 0 0 0  Moving slowly or fidgety/restless 0 0 0 0 0  Suicidal thoughts 0 0 0 0 0  PHQ-9 Score 4 6 0 2 4  Difficult doing work/chores Not difficult at all Not difficult at all Not difficult at all Not difficult at all Not difficult at all       11/03/2022    4:07 PM 08/04/2022    7:58 AM 05/05/2022    7:59 AM 02/01/2022    7:55 AM  GAD 7 : Generalized Anxiety Score  Nervous, Anxious, on Edge 0 0 0 0  Control/stop worrying 0 0 0 0  Worry too much - different things 0 0 0 0  Trouble relaxing 0 0 0 0  Restless 0 0 0 0  Easily annoyed or irritable 0 3 0 0  Afraid - awful might happen 0 0 0 0  Total GAD 7 Score 0 3 0 0  Anxiety Difficulty Not difficult at all Not difficult at all Not difficult at all Not difficult at all   Pertinent labs & imaging results that were available during my care of the patient were reviewed by me and considered in my medical decision making.  Assessment & Plan:  Bekim was seen today for cold/flu like symptoms.  Diagnoses and all orders for this visit:  Sore throat Will start medication as below for empiric treatment of bacterial sinusitis given timeline of illness. Discussed return precautions. Discussed continuing OTC supportive treatments.  -     amoxicillin-clavulanate (AUGMENTIN) 875-125 MG tablet; Take 1 tablet by mouth 2 (two) times daily.  Sinus congestion As above. -     amoxicillin-clavulanate (AUGMENTIN) 875-125 MG tablet; Take 1 tablet by mouth 2 (two) times daily.  Acute cough As above.  -     amoxicillin-clavulanate (AUGMENTIN) 875-125 MG tablet; Take 1 tablet by mouth 2 (two) times daily.  Continue all other maintenance  medications.  Follow up plan: Return if symptoms worsen or fail to improve.   Continue healthy lifestyle choices, including diet (rich in fruits, vegetables, and lean proteins, and low in salt and simple carbohydrates) and exercise (at least 30 minutes of moderate physical activity daily).  Written and verbal instructions provided   The above assessment and management plan was discussed with the patient. The patient verbalized understanding of and has agreed to the management plan. Patient is aware to call the clinic if they develop any new symptoms or if symptoms persist or worsen. Patient is aware when to return to the clinic for a follow-up visit. Patient educated on when it is appropriate to go to the emergency department.   Neale Burly, DNP-FNP [facility] [address] [place], [place] [address] [phone]

## 2022-12-22 NOTE — Patient Instructions (Signed)

## 2023-01-14 LAB — HM DIABETES EYE EXAM

## 2023-01-19 ENCOUNTER — Other Ambulatory Visit: Payer: Self-pay | Admitting: Family Medicine

## 2023-01-19 DIAGNOSIS — N401 Enlarged prostate with lower urinary tract symptoms: Secondary | ICD-10-CM

## 2023-02-13 ENCOUNTER — Other Ambulatory Visit: Payer: Self-pay | Admitting: Family Medicine

## 2023-02-13 DIAGNOSIS — E1165 Type 2 diabetes mellitus with hyperglycemia: Secondary | ICD-10-CM

## 2023-03-17 ENCOUNTER — Encounter: Payer: Self-pay | Admitting: Family Medicine

## 2023-03-28 ENCOUNTER — Ambulatory Visit: Payer: No Typology Code available for payment source | Admitting: Family Medicine

## 2023-03-28 VITALS — BP 117/84 | Ht 70.0 in | Wt 314.0 lb

## 2023-03-28 DIAGNOSIS — R000 Tachycardia, unspecified: Secondary | ICD-10-CM

## 2023-03-28 DIAGNOSIS — A084 Viral intestinal infection, unspecified: Secondary | ICD-10-CM

## 2023-03-28 MED ORDER — ONDANSETRON HCL 4 MG PO TABS: 4.0000 mg | ORAL_TABLET | Freq: Three times a day (TID) | ORAL | 0 refills | Status: DC | PRN

## 2023-03-28 NOTE — Progress Notes (Signed)
Acute Office Visit  Subjective:     Patient ID: Duane Barrera, male    DOB: 03-17-1971, 52 y.o.   MRN: [id number]  Chief Complaint  Patient presents with   Diarrhea   Vomiting    Diarrhea  This is a new problem. Episode onset: 3 days. The problem occurs more than 10 times per day (4x today). The problem has been gradually improving. Associated symptoms include abdominal pain (burning pain), bloating, chills, coughing, headaches, increased flatus, sweats, vomiting (5-6 episodes a day, black in color today- not coffee grounds after pepto. NBNB) and weight loss. Pertinent negatives include no fever or myalgias. Risk factors include ill contacts (son and daughter both have had GI symptoms this week). He has tried bismuth subsalicylate for the symptoms. The treatment provided no relief. GERD   He has 16 ounces of gatorode and 20 ounce cup of ginger ale in the last 24 hours.   Review of Systems  Constitutional:  Positive for chills and weight loss. Negative for fever.  Respiratory:  Positive for cough.   Gastrointestinal:  Positive for abdominal pain (burning pain), bloating, diarrhea, flatus and vomiting (5-6 episodes a day, black in color today- not coffee grounds after pepto. NBNB).  Musculoskeletal:  Negative for myalgias.  Neurological:  Positive for headaches.        Objective:    BP 117/84   Ht 5\' 10"  (1.778 m)   Wt (!) 314 lb (142.4 kg)   BMI 45.05 kg/m  Wt Readings from Last 3 Encounters:  03/28/23 (!) 314 lb (142.4 kg)  12/22/22 (!) 320 lb (145.2 kg)  11/03/22 (!) 323 lb (146.5 kg)      Physical Exam Vitals and nursing note reviewed.  Constitutional:      General: He is not in acute distress.    Appearance: He is obese. He is ill-appearing. He is not toxic-appearing or diaphoretic.  HENT:     Mouth/Throat:     Mouth: Mucous membranes are moist.     Pharynx: Oropharynx is clear. No oropharyngeal exudate or posterior oropharyngeal erythema.  Eyes:      General: No scleral icterus.    Conjunctiva/sclera: Conjunctivae normal.  Cardiovascular:     Rate and Rhythm: Regular rhythm. Tachycardia present.     Heart sounds: Normal heart sounds. No murmur heard. Pulmonary:     Effort: Pulmonary effort is normal. No respiratory distress.     Breath sounds: Normal breath sounds. No wheezing, rhonchi or rales.  Abdominal:     General: Abdomen is protuberant. Bowel sounds are normal.     Palpations: Abdomen is soft.     Tenderness: There is generalized abdominal tenderness.  Musculoskeletal:     Cervical back: Neck supple. No rigidity.     Right lower leg: No edema.     Left lower leg: No edema.  Skin:    General: Skin is warm and dry.     Coloration: Skin is not jaundiced.  Neurological:     General: No focal deficit present.     Mental Status: He is alert and oriented to person, place, and time.  Psychiatric:        Mood and Affect: Mood normal.        Behavior: Behavior normal.     No results found for any visits on 03/28/23.      Assessment & Plan:   Duane Barrera was seen today for diarrhea and vomiting.  Diagnoses and all orders for this visit:  Viral gastroenteritis  Zofran prn for nausea, vomiting. Immodium AD for diarrhea. Discussed importance of hydration.  -     ondansetron (ZOFRAN) 4 MG tablet; Take 1-2 tablets (4-8 mg total) by mouth every 8 (eight) hours as needed for nausea or vomiting.  Tachycardia Mild tachycardia today. Discussed oral rehydration.   Return to office for new or worsening symptoms, or if symptoms persist. Discussed when to seek emergency care.   The patient indicates understanding of these issues and agrees with the plan.  Gabriel Earing, FNP

## 2023-05-06 ENCOUNTER — Ambulatory Visit: Payer: No Typology Code available for payment source | Admitting: Family Medicine

## 2023-06-06 ENCOUNTER — Other Ambulatory Visit: Payer: Self-pay | Admitting: Family Medicine

## 2023-06-06 DIAGNOSIS — E1165 Type 2 diabetes mellitus with hyperglycemia: Secondary | ICD-10-CM

## 2023-07-11 ENCOUNTER — Encounter: Payer: Self-pay | Admitting: Family Medicine

## 2023-07-11 ENCOUNTER — Other Ambulatory Visit: Payer: Self-pay | Admitting: Family Medicine

## 2023-07-11 DIAGNOSIS — E1165 Type 2 diabetes mellitus with hyperglycemia: Secondary | ICD-10-CM

## 2023-07-11 NOTE — Telephone Encounter (Signed)
 Letter sent.

## 2023-07-11 NOTE — Telephone Encounter (Signed)
 [facility] pt NTBS 30-d given 06/06/23

## 2023-07-28 ENCOUNTER — Ambulatory Visit: Admitting: Family Medicine

## 2023-07-28 ENCOUNTER — Encounter: Payer: Self-pay | Admitting: Family Medicine

## 2023-07-28 VITALS — BP 123/88 | HR 87 | Temp 97.6°F | Ht 70.0 in | Wt 322.0 lb

## 2023-07-28 DIAGNOSIS — E1165 Type 2 diabetes mellitus with hyperglycemia: Secondary | ICD-10-CM

## 2023-07-28 DIAGNOSIS — K219 Gastro-esophageal reflux disease without esophagitis: Secondary | ICD-10-CM | POA: Diagnosis not present

## 2023-07-28 DIAGNOSIS — Z6841 Body Mass Index (BMI) 40.0 and over, adult: Secondary | ICD-10-CM

## 2023-07-28 LAB — BAYER DCA HB A1C WAIVED: HB A1C (BAYER DCA - WAIVED): 7.1 % — ABNORMAL HIGH (ref 4.8–5.6)

## 2023-07-28 MED ORDER — SAXAGLIPTIN HCL 5 MG PO TABS
5.0000 mg | ORAL_TABLET | Freq: Every day | ORAL | 3 refills | Status: DC
Start: 2023-07-28 — End: 2023-08-18

## 2023-07-28 MED ORDER — FLUTICASONE PROPIONATE 50 MCG/ACT NA SUSP: 1.0000 | Freq: Every day | NASAL | 2 refills | Status: DC

## 2023-07-28 NOTE — Progress Notes (Signed)
 Established Patient Office Visit  Subjective   Patient ID: Duane Barrera, male    DOB: 09-Sep-1970  Age: 53 y.o. MRN: [id number]  Chief Complaint  Patient presents with   Medical Management of Chronic Issues    HPI T2DM Pt presents for follow up evaluation of Type 2 diabetes mellitus. Patient denies foot ulcerations, hypoglycemia , visual disturbances, and vomiting.  Current diabetic medications include: saxagliptin 2.5 mg Compliant with meds - Yes  Current monitoring regimen: home blood tests - 3 times daily Home blood sugar records:  150-180s fasting and before eating, 190-200 postprandial Any episodes of hypoglycemia? No   Current diet:  regular Current exercise:  limited due to back pain  Urine microalbumin UTD? Yes Is He on ACE inhibitor or angiotensin II receptor blocker?  No, declined Is He on statin? No declined Is He on ASA 81 mg daily?  No  2. GERD Compliant with medications - stopped taking medication  Current medications - none Voice change - No Hemoptysis - No Dysphagia or dyspepsia - No Water brash - Yes., especially at night Red Flags (weight loss, hematochezia, melena, weight loss, early satiety, fevers, odynophagia, or persistent vomiting) - No  3. OSA Start oral device but has to remove at night due to water brash.    Past Medical History:  Diagnosis Date   Arthritis    both knees and elbow   Chronic kidney disease    h/o kidneys 2003-04   Complication of anesthesia    GERD (gastroesophageal reflux disease)    otc tums   Headache    h/o migraines   History of hiatal hernia    History of kidney stones    PONV (postoperative nausea and vomiting)    Sleep apnea       ROS As per HPI.    Objective:     BP 123/88   Pulse 87   Temp 97.6 F (36.4 C) (Temporal)   Ht 5\' 10"  (1.778 m)   Wt (!) 322 lb (146.1 kg)   SpO2 97%   BMI 46.20 kg/m  Wt Readings from Last 3 Encounters:  07/28/23 (!) 322 lb (146.1 kg)  03/28/23 (!) 314 lb  (142.4 kg)  12/22/22 (!) 320 lb (145.2 kg)     Physical Exam Vitals and nursing note reviewed.  Constitutional:      General: He is not in acute distress.    Appearance: He is obese. He is not ill-appearing, toxic-appearing or diaphoretic.  Cardiovascular:     Rate and Rhythm: Normal rate and regular rhythm.     Heart sounds: Normal heart sounds. No murmur heard. Pulmonary:     Effort: Pulmonary effort is normal. No respiratory distress.     Breath sounds: Normal breath sounds.  Abdominal:     General: Bowel sounds are normal. There is no distension.     Palpations: Abdomen is soft.     Tenderness: There is no abdominal tenderness. There is no guarding or rebound.  Musculoskeletal:     Right lower leg: No edema.     Left lower leg: No edema.  Skin:    General: Skin is warm and dry.  Neurological:     General: No focal deficit present.     Mental Status: He is alert and oriented to person, place, and time.  Psychiatric:        Mood and Affect: Mood normal.        Behavior: Behavior normal.    The 10-year  ASCVD risk score (Arnett DK, et al., 2019) is: 8.3%    Assessment & Plan:   Jermery was seen today for medical management of chronic issues.  Diagnoses and all orders for this visit:  Type 2 diabetes mellitus with hyperglycemia, without long-term current use of insulin (HCC) A1c 7.1 today, not at goal of <7. Increase saxagliptin to 5 mg daily. Declined ACE/ARB and statin. Diet and exercise.  -     Bayer DCA Hb A1c Waived -     CBC with Differential/Platelet -     CMP14+EGFR -     Lipid panel -     fluticasone (FLONASE) 50 MCG/ACT nasal spray; Place 1 spray into both nostrils daily. -     saxagliptin HCl (ONGLYZA) 5 MG TABS tablet; Take 1 tablet (5 mg total) by mouth daily.  Morbid obesity (HCC) Trending up. Diet and exercise as tolerated.   Gastroesophageal reflux disease, unspecified whether esophagitis present Declines medication due to side effects.   Return  in about 14 weeks (around 11/03/2023) for CPE with fasting labs, ok to add on in 30 min appt. .  The patient indicates understanding of these issues and agrees with the plan.   Albertha Huger, FNP

## 2023-07-29 ENCOUNTER — Encounter: Payer: Self-pay | Admitting: Family Medicine

## 2023-07-29 LAB — CBC WITH DIFFERENTIAL/PLATELET
Basophils Absolute: 0.1 10*3/uL (ref 0.0–0.2)
Basos: 1 %
EOS (ABSOLUTE): 0.3 10*3/uL (ref 0.0–0.4)
Eos: 3 %
Hematocrit: 43.1 % (ref 37.5–51.0)
Hemoglobin: 14.4 g/dL (ref 13.0–17.7)
Immature Grans (Abs): 0 10*3/uL (ref 0.0–0.1)
Immature Granulocytes: 0 %
Lymphocytes Absolute: 3.3 10*3/uL — ABNORMAL HIGH (ref 0.7–3.1)
Lymphs: 40 %
MCH: 29.6 pg (ref 26.6–33.0)
MCHC: 33.4 g/dL (ref 31.5–35.7)
MCV: 89 fL (ref 79–97)
Monocytes Absolute: 0.6 10*3/uL (ref 0.1–0.9)
Monocytes: 7 %
Neutrophils Absolute: 4 10*3/uL (ref 1.4–7.0)
Neutrophils: 49 %
Platelets: 218 10*3/uL (ref 150–450)
RBC: 4.86 x10E6/uL (ref 4.14–5.80)
RDW: 12.2 % (ref 11.6–15.4)
WBC: 8.2 10*3/uL (ref 3.4–10.8)

## 2023-07-29 LAB — LIPID PANEL
Chol/HDL Ratio: 4.1 ratio (ref 0.0–5.0)
Cholesterol, Total: 142 mg/dL (ref 100–199)
HDL: 35 mg/dL — ABNORMAL LOW (ref >39)
LDL Chol Calc (NIH): 70 mg/dL (ref 0–99)
Triglycerides: 224 mg/dL — ABNORMAL HIGH (ref 0–149)
VLDL Cholesterol Cal: 37 mg/dL (ref 5–40)

## 2023-07-29 LAB — CMP14+EGFR
ALT: 24 IU/L (ref 0–44)
AST: 29 IU/L (ref 0–40)
Albumin: 3.8 g/dL (ref 3.8–4.9)
Alkaline Phosphatase: 86 IU/L (ref 44–121)
BUN/Creatinine Ratio: 9 (ref 9–20)
BUN: 10 mg/dL (ref 6–24)
Bilirubin Total: 0.5 mg/dL (ref 0.0–1.2)
CO2: 24 mmol/L (ref 20–29)
Calcium: 9.3 mg/dL (ref 8.7–10.2)
Chloride: 100 mmol/L (ref 96–106)
Creatinine, Ser: 1.14 mg/dL (ref 0.76–1.27)
Globulin, Total: 3.2 g/dL (ref 1.5–4.5)
Glucose: 172 mg/dL — ABNORMAL HIGH (ref 70–99)
Potassium: 4.2 mmol/L (ref 3.5–5.2)
Sodium: 139 mmol/L (ref 134–144)
Total Protein: 7 g/dL (ref 6.0–8.5)
eGFR: 76 mL/min/{1.73_m2} (ref >59)

## 2023-08-16 ENCOUNTER — Encounter: Payer: Self-pay | Admitting: Family Medicine

## 2023-08-16 DIAGNOSIS — E1165 Type 2 diabetes mellitus with hyperglycemia: Secondary | ICD-10-CM

## 2023-08-17 ENCOUNTER — Other Ambulatory Visit: Payer: Self-pay | Admitting: Family Medicine

## 2023-08-17 DIAGNOSIS — E1165 Type 2 diabetes mellitus with hyperglycemia: Secondary | ICD-10-CM

## 2023-08-17 MED ORDER — SITAGLIPTIN PHOSPHATE 100 MG PO TABS: 100.0000 mg | ORAL_TABLET | Freq: Every day | ORAL | 1 refills | Status: DC

## 2023-08-17 NOTE — Telephone Encounter (Signed)
 ZITUVIO  100 MG TABS        Changed from: sitaGLIPtin  (JANUVIA ) 100 MG tablet    Pharmacy comment: Alternative Requested:DRUG NOT COVERED; PLEASE CONTACT INSURANCE OR CONSIDER ALTERNATIVE.

## 2023-08-18 ENCOUNTER — Other Ambulatory Visit: Payer: Self-pay

## 2023-08-18 DIAGNOSIS — E1165 Type 2 diabetes mellitus with hyperglycemia: Secondary | ICD-10-CM

## 2023-08-18 MED ORDER — SAXAGLIPTIN HCL 5 MG PO TABS
5.0000 mg | ORAL_TABLET | Freq: Every day | ORAL | 3 refills | Status: DC
Start: 2023-08-18 — End: 2023-08-25

## 2023-08-19 ENCOUNTER — Telehealth: Payer: Self-pay | Admitting: Family Medicine

## 2023-08-19 DIAGNOSIS — E1165 Type 2 diabetes mellitus with hyperglycemia: Secondary | ICD-10-CM

## 2023-08-19 NOTE — Telephone Encounter (Signed)
 Pt aware of provider feedback and will call his insurance and let us  know.

## 2023-08-19 NOTE — Telephone Encounter (Signed)
 Please have the patient call to see what his insurance will cover.

## 2023-08-19 NOTE — Telephone Encounter (Signed)
 ONGLYZA 5 MG TABS tablet   Pharmacy comment: SAXAGLIPTIN  TAB 5MG  is on back order.  To prescribe an alternate please respond with appropriate changes.  (IF applicable, please review your patients formulary for alternate)

## 2023-08-22 MED ORDER — GLIMEPIRIDE 1 MG PO TABS: 1.0000 mg | ORAL_TABLET | Freq: Every day | ORAL | 3 refills | Status: DC

## 2023-08-22 NOTE — Addendum Note (Signed)
 Addended by: Albertha Huger on: 08/22/2023 03:36 PM   Modules accepted: Orders

## 2023-08-25 ENCOUNTER — Telehealth: Payer: Self-pay

## 2023-08-25 ENCOUNTER — Other Ambulatory Visit: Payer: Self-pay | Admitting: Family

## 2023-08-25 MED ORDER — SITAGLIPTIN PHOSPHATE 100 MG PO TABS: 100.0000 mg | ORAL_TABLET | Freq: Every day | ORAL | 1 refills | Status: DC

## 2023-08-25 NOTE — Addendum Note (Signed)
 Addended by: Tommas Fragmin A on: 08/25/2023 01:50 PM   Modules accepted: Orders

## 2023-08-25 NOTE — Telephone Encounter (Signed)
 Copied from CRM [id number]. Topic: Clinical - Prescription Issue >> Aug 25, 2023  8:39 AM Crispin Dolphin wrote: Reason for CRM: Pharmacy called. Stated saxagliptin  HCl (ONGLYZA) 5 MG TABS tablet is currently on back order. No ETA. Will need a new Rx for an alternative or Rx to be sent to different pharmacy that has it in stock. Will put order on hold until they hear back. Phone: [phone] option 2 ref # [id number] [id number]. Thank You

## 2023-08-25 NOTE — Telephone Encounter (Signed)
 Spoke with patient. He states [facility] sent in glimepiride. He states Januvia  is expensive.

## 2023-08-25 NOTE — Telephone Encounter (Signed)
 saxagliptin  HCL changed to Januvia  100 mg. Prescription sent to pharmacy

## 2023-08-26 NOTE — Telephone Encounter (Signed)
 saxagliptin  HCl (ONGLYZA) 5 MG TABS tablet        Changed from: sitaGLIPtin  (JANUVIA ) 100 MG tablet   Pharmacy comment: Alternative Requested:NON FORMULARY DRUG; PLEASE CONTACT INSURANCE OR CONSIDER ALTERNATIVE.   All Pharmacy Suggested Alternatives:  saxagliptin  HCl (ONGLYZA) 5 MG TABS tablet SITagliptin  (ZITUVIO ) 100 MG TABS

## 2023-11-07 ENCOUNTER — Ambulatory Visit (INDEPENDENT_AMBULATORY_CARE_PROVIDER_SITE_OTHER): Admitting: *Deleted

## 2023-11-07 ENCOUNTER — Encounter: Payer: Self-pay | Admitting: Family Medicine

## 2023-11-07 ENCOUNTER — Ambulatory Visit (INDEPENDENT_AMBULATORY_CARE_PROVIDER_SITE_OTHER): Admitting: Family Medicine

## 2023-11-07 VITALS — BP 123/82 | HR 83 | Temp 98.0°F | Ht 70.0 in | Wt 324.8 lb

## 2023-11-07 DIAGNOSIS — Z0001 Encounter for general adult medical examination with abnormal findings: Secondary | ICD-10-CM | POA: Diagnosis not present

## 2023-11-07 DIAGNOSIS — Z0000 Encounter for general adult medical examination without abnormal findings: Secondary | ICD-10-CM

## 2023-11-07 DIAGNOSIS — Z6841 Body Mass Index (BMI) 40.0 and over, adult: Secondary | ICD-10-CM

## 2023-11-07 DIAGNOSIS — E1165 Type 2 diabetes mellitus with hyperglycemia: Secondary | ICD-10-CM | POA: Diagnosis not present

## 2023-11-07 LAB — HM DIABETES EYE EXAM

## 2023-11-07 LAB — BAYER DCA HB A1C WAIVED: HB A1C (BAYER DCA - WAIVED): 6.9 % — ABNORMAL HIGH (ref 4.8–5.6)

## 2023-11-07 MED ORDER — BLOOD GLUCOSE TEST VI STRP: 1.0000 | ORAL_STRIP | Freq: Three times a day (TID) | 3 refills | Status: DC

## 2023-11-07 NOTE — Progress Notes (Signed)
 Complete physical exam  Patient: Duane Barrera   DOB: 11/20/1970   53 y.o. Male  MRN: [id number]  Subjective:    Chief Complaint  Patient presents with   Annual Exam    Duane Barrera is a 53 y.o. male who presents today for a complete physical exam. He reports consuming a general diet. The patient does not participate in regular exercise at present. He generally feels well. He reports sleeping fairly well. He does not have additional problems to discuss today.   Back pain has been stable. Left knee pain has been stable. These limit his ability to exercise.   Most recent fall risk assessment:    11/07/2023    8:41 AM  Fall Risk   Falls in the past year? 0     Most recent depression screenings:    11/07/2023    8:41 AM 07/28/2023    4:00 PM  PHQ 2/9 Scores  PHQ - 2 Score 0 0  PHQ- 9 Score 6     Vision:Within last year and Dental: No current dental problems and Receives regular dental care  Past Medical History:  Diagnosis Date   Arthritis    both knees and elbow   Chronic kidney disease    h/o kidneys 2003-04   Complication of anesthesia    GERD (gastroesophageal reflux disease)    otc tums   Headache    h/o migraines   History of hiatal hernia    History of kidney stones    PONV (postoperative nausea and vomiting)    Sleep apnea       Patient Care Team: Joesph Annabella HERO, FNP as PCP - General (Family Medicine)   Outpatient Medications Prior to Visit  Medication Sig   Accu-Chek Softclix Lancets lancets TEST BLOOD SUGAR 4 TIMES   DAILY,   blood glucose meter kit and supplies Dispense based on patient and insurance preference. Use up to four times daily as directed. (FOR ICD-10 E10.9, E11.9).   famotidine  (PEPCID ) 20 MG tablet Take 20 mg by mouth 2 (two) times daily.   fluticasone  (FLONASE ) 50 MCG/ACT nasal spray Place 1 spray into both nostrils daily.   glucose blood (ACCU-CHEK GUIDE) test strip Test BS QID Dx E11.65   saxagliptin  HCl (ONGLYZA) 5 MG  TABS tablet Take 1 tablet (5 mg total) by mouth daily.   [DISCONTINUED] glimepiride  (AMARYL ) 1 MG tablet Take 1 tablet (1 mg total) by mouth daily with breakfast.   No facility-administered medications prior to visit.    ROS Negative unless specially indicated above in HPI.     Objective:     BP 123/82   Pulse 83   Temp 98 F (36.7 C) (Temporal)   Ht 5' 10 (1.778 m)   Wt (!) 324 lb 12.8 oz (147.3 kg)   SpO2 96%   BMI 46.60 kg/m  Wt Readings from Last 3 Encounters:  11/07/23 (!) 324 lb 12.8 oz (147.3 kg)  07/28/23 (!) 322 lb (146.1 kg)  03/28/23 (!) 314 lb (142.4 kg)      Physical Exam Vitals and nursing note reviewed.  Constitutional:      General: He is not in acute distress.    Appearance: He is obese. He is not ill-appearing, toxic-appearing or diaphoretic.  HENT:     Head: Normocephalic.     Right Ear: Tympanic membrane, ear canal and external ear normal.     Left Ear: Tympanic membrane, ear canal and external ear normal.  Nose: Nose normal.     Mouth/Throat:     Mouth: Mucous membranes are moist.     Pharynx: Oropharynx is clear.  Eyes:     Extraocular Movements: Extraocular movements intact.     Conjunctiva/sclera: Conjunctivae normal.     Pupils: Pupils are equal, round, and reactive to light.  Cardiovascular:     Rate and Rhythm: Normal rate and regular rhythm.     Pulses: Normal pulses.     Heart sounds: Normal heart sounds. No murmur heard.    No friction rub. No gallop.  Pulmonary:     Effort: Pulmonary effort is normal.     Breath sounds: Normal breath sounds.  Abdominal:     General: Bowel sounds are normal. There is no distension.     Palpations: Abdomen is soft. There is no mass.     Tenderness: There is no abdominal tenderness. There is no guarding.  Musculoskeletal:     Cervical back: Normal range of motion and neck supple. No tenderness.     Right lower leg: No edema.     Left lower leg: No edema.  Skin:    General: Skin is warm and  dry.     Capillary Refill: Capillary refill takes less than 2 seconds.     Findings: No lesion or rash.  Neurological:     General: No focal deficit present.     Mental Status: He is alert and oriented to person, place, and time.  Psychiatric:        Mood and Affect: Mood normal.        Behavior: Behavior normal.        Thought Content: Thought content normal.     Diabetic Foot Exam - Simple   Simple Foot Form Diabetic Foot exam was performed with the following findings: Yes 11/07/2023  9:19 AM  Visual Inspection Sensation Testing Intact to touch and monofilament testing bilaterally: Yes Pulse Check Posterior Tibialis and Dorsalis pulse intact bilaterally: Yes Comments Calluses present bilaterally. No skin breakdown. Thickened toe nails.      No results found for any visits on 11/07/23.     Assessment & Plan:    Routine Health Maintenance and Physical Exam Correll was seen today for annual exam.  Diagnoses and all orders for this visit:  Routine general medical examination at a health care facility  Type 2 diabetes mellitus with hyperglycemia, without long-term current use of insulin  (HCC) A1c 6.9 today, at goal of <7. Medication changes today: continue current regimen. Declines ACE/ARB and statin. Diet and exercise.  -     CBC with Differential/Platelet -     CMP14+EGFR -     Bayer DCA Hb A1c Waived -     Microalbumin / creatinine urine ratio -     Glucose Blood (BLOOD GLUCOSE TEST STRIPS) STRP; 1 each by In Vitro route in the morning, at noon, and at bedtime. May substitute to any manufacturer covered by patient's insurance.  Morbid obesity (HCC) Diet, exercise, weight loss.  -     TSH  Immunization History  Administered Date(s) Administered   Influenza Split 01/23/2010, 02/09/2012, 01/31/2013   Influenza,inj,Quad PF,6+ Mos 12/22/2018   Influenza,trivalent, recombinat, inj, PF 11/19/2022   Influenza-Unspecified 01/29/2017, 11/24/2017, 12/20/2018   PFIZER(Purple  Top)SARS-COV-2 Vaccination 07/05/2019   Tdap 04/22/2016   Zoster Recombinant(Shingrix) 11/12/2020, 05/08/2021    Health Maintenance  Topic Date Due   Diabetic kidney evaluation - Urine ACR  11/03/2023   Pneumococcal Vaccine 23-42 Years old (  1 of 2 - PCV) 07/27/2024 (Originally 05/15/1988)   Hepatitis B Vaccines (1 of 3 - 19+ 3-dose series) 11/06/2024 (Originally 05/15/1988)   COVID-19 Vaccine (2 - 2024-25 season) 11/22/2024 (Originally 12/12/2022)   INFLUENZA VACCINE  11/11/2023   Fecal DNA (Cologuard)  12/12/2023   OPHTHALMOLOGY EXAM  01/14/2024   HEMOGLOBIN A1C  01/27/2024   Diabetic kidney evaluation - eGFR measurement  07/27/2024   FOOT EXAM  11/06/2024   DTaP/Tdap/Td (2 - Td or Tdap) 04/22/2026   Hepatitis C Screening  Completed   HIV Screening  Completed   Zoster Vaccines- Shingrix  Completed   HPV VACCINES  Aged Out   Meningococcal B Vaccine  Aged Out    Discussed health benefits of physical activity, and encouraged him to engage in regular exercise appropriate for his age and condition.  Problem List Items Addressed This Visit       Endocrine   Type 2 diabetes mellitus with hyperglycemia, without long-term current use of insulin  (HCC)   Relevant Medications   Glucose Blood (BLOOD GLUCOSE TEST STRIPS) STRP   Other Relevant Orders   CBC with Differential/Platelet   CMP14+EGFR   Bayer DCA Hb A1c Waived   Microalbumin / creatinine urine ratio     Other   Morbid obesity (HCC)   Relevant Orders   TSH   Other Visit Diagnoses       Routine general medical examination at a health care facility    -  Primary      Return in about 3 months (around 02/07/2024) for chronic follow up.   The patient indicates understanding of these issues and agrees with the plan.  Annabella CHRISTELLA Search, FNP

## 2023-11-07 NOTE — Patient Instructions (Signed)
 Health Maintenance, Male  Adopting a healthy lifestyle and getting preventive care are important in promoting health and wellness. Ask your health care provider about:  The right schedule for you to have regular tests and exams.  Things you can do on your own to prevent diseases and keep yourself healthy.  What should I know about diet, weight, and exercise?  Eat a healthy diet    Eat a diet that includes plenty of vegetables, fruits, low-fat dairy products, and lean protein.  Do not eat a lot of foods that are high in solid fats, added sugars, or sodium.  Maintain a healthy weight  Body mass index (BMI) is a measurement that can be used to identify possible weight problems. It estimates body fat based on height and weight. Your health care provider can help determine your BMI and help you achieve or maintain a healthy weight.  Get regular exercise  Get regular exercise. This is one of the most important things you can do for your health. Most adults should:  Exercise for at least 150 minutes each week. The exercise should increase your heart rate and make you sweat (moderate-intensity exercise).  Do strengthening exercises at least twice a week. This is in addition to the moderate-intensity exercise.  Spend less time sitting. Even light physical activity can be beneficial.  Watch cholesterol and blood lipids  Have your blood tested for lipids and cholesterol at 54 years of age, then have this test every 5 years.  You may need to have your cholesterol levels checked more often if:  Your lipid or cholesterol levels are high.  You are older than 54 years of age.  You are at high risk for heart disease.  What should I know about cancer screening?  Many types of cancers can be detected early and may often be prevented. Depending on your health history and family history, you may need to have cancer screening at various ages. This may include screening for:  Colorectal cancer.  Prostate cancer.  Skin cancer.  Lung  cancer.  What should I know about heart disease, diabetes, and high blood pressure?  Blood pressure and heart disease  High blood pressure causes heart disease and increases the risk of stroke. This is more likely to develop in people who have high blood pressure readings or are overweight.  Talk with your health care provider about your target blood pressure readings.  Have your blood pressure checked:  Every 3-5 years if you are 9-95 years of age.  Every year if you are 85 years old or older.  If you are between the ages of 29 and 29 and are a current or former smoker, ask your health care provider if you should have a one-time screening for abdominal aortic aneurysm (AAA).  Diabetes  Have regular diabetes screenings. This checks your fasting blood sugar level. Have the screening done:  Once every three years after age 23 if you are at a normal weight and have a low risk for diabetes.  More often and at a younger age if you are overweight or have a high risk for diabetes.  What should I know about preventing infection?  Hepatitis B  If you have a higher risk for hepatitis B, you should be screened for this virus. Talk with your health care provider to find out if you are at risk for hepatitis B infection.  Hepatitis C  Blood testing is recommended for:  Everyone born from [birth year] through 1966.  Anyone  with known risk factors for hepatitis C.  Sexually transmitted infections (STIs)  You should be screened each year for STIs, including gonorrhea and chlamydia, if:  You are sexually active and are younger than 54 years of age.  You are older than 54 years of age and your health care provider tells you that you are at risk for this type of infection.  Your sexual activity has changed since you were last screened, and you are at increased risk for chlamydia or gonorrhea. Ask your health care provider if you are at risk.  Ask your health care provider about whether you are at high risk for HIV. Your health care provider  may recommend a prescription medicine to help prevent HIV infection. If you choose to take medicine to prevent HIV, you should first get tested for HIV. You should then be tested every 3 months for as long as you are taking the medicine.  Follow these instructions at home:  Alcohol use  Do not drink alcohol if your health care provider tells you not to drink.  If you drink alcohol:  Limit how much you have to 0-2 drinks a day.  Know how much alcohol is in your drink. In the [place] one drink equals one 12 oz bottle of beer (355 mL), one 5 oz glass of wine (148 mL), or one 1 oz glass of hard liquor (44 mL).  Lifestyle  Do not use any products that contain nicotine or tobacco. These products include cigarettes, chewing tobacco, and vaping devices, such as e-cigarettes. If you need help quitting, ask your health care provider.  Do not use street drugs.  Do not share needles.  Ask your health care provider for help if you need support or information about quitting drugs.  General instructions  Schedule regular health, dental, and eye exams.  Stay current with your vaccines.  Tell your health care provider if:  You often feel depressed.  You have ever been abused or do not feel safe at home.  Summary  Adopting a healthy lifestyle and getting preventive care are important in promoting health and wellness.  Follow your health care provider's instructions about healthy diet, exercising, and getting tested or screened for diseases.  Follow your health care provider's instructions on monitoring your cholesterol and blood pressure.  This information is not intended to replace advice given to you by your health care provider. Make sure you discuss any questions you have with your health care provider.  Document Revised: 08/18/2020 Document Reviewed: 08/18/2020  Elsevier Patient Education  2024 [employer].

## 2023-11-07 NOTE — Progress Notes (Signed)
 Arrived on 11/07/2023 and has given verbal consent to obtain images and complete their overdue diabetic retinal screening.  The images have been sent to an ophthalmologist or optometrist for review and interpretation.  Results will be sent back to [facility] for review.  Patient has been informed they will be contacted when we receive the results via telephone or MyChart.  Patient confirms that he did have a regular eye doctor that he saw about a year ago but is looking for a new eye care provider currently.  Patient denies having any noticeable problems with either eye.

## 2023-11-08 ENCOUNTER — Other Ambulatory Visit: Payer: Self-pay | Admitting: *Deleted

## 2023-11-08 LAB — CMP14+EGFR
ALT: 28 IU/L (ref 0–44)
AST: 30 IU/L (ref 0–40)
Albumin: 4 g/dL (ref 3.8–4.9)
Alkaline Phosphatase: 75 IU/L (ref 44–121)
BUN/Creatinine Ratio: 10 (ref 9–20)
BUN: 13 mg/dL (ref 6–24)
Bilirubin Total: 0.5 mg/dL (ref 0.0–1.2)
CO2: 22 mmol/L (ref 20–29)
Calcium: 10.1 mg/dL (ref 8.7–10.2)
Chloride: 103 mmol/L (ref 96–106)
Creatinine, Ser: 1.28 mg/dL — ABNORMAL HIGH (ref 0.76–1.27)
Globulin, Total: 3.1 g/dL (ref 1.5–4.5)
Glucose: 134 mg/dL — ABNORMAL HIGH (ref 70–99)
Potassium: 4.3 mmol/L (ref 3.5–5.2)
Sodium: 139 mmol/L (ref 134–144)
Total Protein: 7.1 g/dL (ref 6.0–8.5)
eGFR: 67 mL/min/1.73 (ref >59)

## 2023-11-08 LAB — CBC WITH DIFFERENTIAL/PLATELET
Basophils Absolute: 0.1 x10E3/uL (ref 0.0–0.2)
Basos: 1 %
EOS (ABSOLUTE): 0.2 x10E3/uL (ref 0.0–0.4)
Eos: 3 %
Hematocrit: 44 % (ref 37.5–51.0)
Hemoglobin: 14 g/dL (ref 13.0–17.7)
Immature Grans (Abs): 0 x10E3/uL (ref 0.0–0.1)
Immature Granulocytes: 0 %
Lymphocytes Absolute: 2.8 x10E3/uL (ref 0.7–3.1)
Lymphs: 37 %
MCH: 29.7 pg (ref 26.6–33.0)
MCHC: 31.8 g/dL (ref 31.5–35.7)
MCV: 93 fL (ref 79–97)
Monocytes Absolute: 0.7 x10E3/uL (ref 0.1–0.9)
Monocytes: 9 %
Neutrophils Absolute: 3.8 x10E3/uL (ref 1.4–7.0)
Neutrophils: 50 %
Platelets: 230 x10E3/uL (ref 150–450)
RBC: 4.72 x10E6/uL (ref 4.14–5.80)
RDW: 12.7 % (ref 11.6–15.4)
WBC: 7.5 x10E3/uL (ref 3.4–10.8)

## 2023-11-08 LAB — MICROALBUMIN / CREATININE URINE RATIO
Creatinine, Urine: 166 mg/dL
Microalb/Creat Ratio: 2 mg/g{creat} (ref 0–29)
Microalbumin, Urine: 3.1 ug/mL

## 2023-11-08 LAB — TSH: TSH: 2.4 u[IU]/mL (ref 0.450–4.500)

## 2023-11-08 MED ORDER — ACCU-CHEK GUIDE TEST VI STRP
ORAL_STRIP | 3 refills | Status: AC
Start: 2023-11-08 — End: ?

## 2023-11-08 NOTE — Telephone Encounter (Signed)
 Accu-Chek guide test strips

## 2023-11-09 ENCOUNTER — Ambulatory Visit: Payer: Self-pay | Admitting: Family Medicine

## 2023-12-05 ENCOUNTER — Ambulatory Visit: Payer: Self-pay

## 2023-12-05 ENCOUNTER — Telehealth: Admitting: Physician Assistant

## 2023-12-05 DIAGNOSIS — J208 Acute bronchitis due to other specified organisms: Secondary | ICD-10-CM

## 2023-12-05 DIAGNOSIS — B9689 Other specified bacterial agents as the cause of diseases classified elsewhere: Secondary | ICD-10-CM

## 2023-12-05 MED ORDER — AZITHROMYCIN 250 MG PO TABS: ORAL_TABLET | ORAL | 0 refills | Status: AC

## 2023-12-05 MED ORDER — PREDNISONE 20 MG PO TABS: 40.0000 mg | ORAL_TABLET | Freq: Every day | ORAL | 0 refills | Status: DC

## 2023-12-05 NOTE — Telephone Encounter (Signed)
 Pt has virtual appt

## 2023-12-05 NOTE — Patient Instructions (Signed)
 Duane Barrera, thank you for joining [name], PA-C for today's virtual visit.  While this provider is not your primary care provider (PCP), if your PCP is located in our provider database this encounter information will be shared with them immediately following your visit.   A [facility] MyChart account gives you access to today's visit and all your visits, tests, and labs performed at [facility]  click here if you don't have a [facility] MyChart account or go to [email or web address]  Consent: (Patient) Duane Barrera provided verbal consent for this virtual visit at the beginning of the encounter.  Current Medications:  Current Outpatient Medications:    azithromycin  (ZITHROMAX ) 250 MG tablet, Take 2 tablets on day 1, then 1 tablet daily on days 2 through 5, Disp: 6 tablet, Rfl: 0   predniSONE  (DELTASONE ) 20 MG tablet, Take 2 tablets (40 mg total) by mouth daily with breakfast., Disp: 10 tablet, Rfl: 0   Accu-Chek Softclix Lancets lancets, TEST BLOOD SUGAR 4 TIMES   DAILY,, Disp: 100 each, Rfl: 8   blood glucose meter kit and supplies, Dispense based on patient and insurance preference. Use up to four times daily as directed. (FOR ICD-10 E10.9, E11.9)., Disp: 1 each, Rfl: 0   famotidine  (PEPCID ) 20 MG tablet, Take 20 mg by mouth 2 (two) times daily., Disp: , Rfl:    fluticasone  (FLONASE ) 50 MCG/ACT nasal spray, Place 1 spray into both nostrils daily., Disp: 48 mL, Rfl: 2   glucose blood (ACCU-CHEK GUIDE TEST) test strip, Test BS in the morning, at noon & at bedtime Dx E11.65, Disp: 300 each, Rfl: 3   saxagliptin  HCl (ONGLYZA) 5 MG TABS tablet, Take 1 tablet (5 mg total) by mouth daily., Disp: 30 tablet, Rfl: 1   Medications ordered in this encounter:  Meds ordered this encounter  Medications   azithromycin  (ZITHROMAX ) 250 MG tablet    Sig: Take 2 tablets on day 1, then 1 tablet daily on days 2 through 5    Dispense:  6 tablet    Refill:  0     Supervising Provider:   LAMPTEY, PHILIP O [[id number]]   predniSONE  (DELTASONE ) 20 MG tablet    Sig: Take 2 tablets (40 mg total) by mouth daily with breakfast.    Dispense:  10 tablet    Refill:  0    Supervising Provider:   BLAISE ALEENE KIDD [[id number]]     *If you need refills on other medications prior to your next appointment, please contact your pharmacy*  Follow-Up: Call back or seek an in-person evaluation if the symptoms worsen or if the condition fails to improve as anticipated.  [facility] [phone]  Other Instructions Acute Bronchitis, Adult  Acute bronchitis is sudden inflammation of the main airways (bronchi) that come off the windpipe (trachea) in the lungs. The swelling causes the airways to get smaller and make more mucus than normal. This can make it hard to breathe and can cause coughing or noisy breathing (wheezing). Acute bronchitis may last several weeks. The cough may last longer. Allergies, asthma, and exposure to smoke may make the condition worse. What are the causes? This condition can be caused by germs and by substances that irritate the lungs, including: Cold and flu viruses. The most common cause of this condition is the virus that causes the common cold. Bacteria. This is less common. Breathing in substances that irritate the lungs, including: Smoke from cigarettes and other forms of tobacco.  Dust and pollen. Fumes from household cleaning products, gases, or burned fuel. Indoor or outdoor air pollution. What increases the risk? The following factors may make you more likely to develop this condition: A weak body's defense system, also called the immune system. A condition that affects your lungs and breathing, such as asthma. What are the signs or symptoms? Common symptoms of this condition include: Coughing. This may bring up clear, yellow, or green mucus from your lungs (sputum). Wheezing. Runny or stuffy nose. Having too much  mucus in your lungs (chest congestion). Shortness of breath. Aches and pains, including sore throat or chest. How is this diagnosed? This condition is usually diagnosed based on: Your symptoms and medical history. A physical exam. You may also have other tests, including tests to rule out other conditions, such as pneumonia. These tests include: A test of lung function. Test of a mucus sample to look for the presence of bacteria. Tests to check the oxygen level in your blood. Blood tests. Chest X-ray. How is this treated? Most cases of acute bronchitis clear up over time without treatment. Your health care provider may recommend: Drinking more fluids to help thin your mucus so it is easier to cough up. Taking inhaled medicine (inhaler) to improve air flow in and out of your lungs. Using a vaporizer or a humidifier. These are machines that add water to the air to help you breathe better. Taking a medicine that thins mucus and clears congestion (expectorant). Taking a medicine that prevents or stops coughing (cough suppressant). It is not common to take an antibiotic medicine for this condition. Follow these instructions at home:  Take over-the-counter and prescription medicines only as told by your health care provider. Use an inhaler, vaporizer, or humidifier as told by your health care provider. Take two teaspoons (10 mL) of honey at bedtime to lessen coughing at night. Drink enough fluid to keep your urine pale yellow. Do not use any products that contain nicotine or tobacco. These products include cigarettes, chewing tobacco, and vaping devices, such as e-cigarettes. If you need help quitting, ask your health care provider. Get plenty of rest. Return to your normal activities as told by your health care provider. Ask your health care provider what activities are safe for you. Keep all follow-up visits. This is important. How is this prevented? To lower your risk of getting this  condition again: Wash your hands often with soap and water for at least 20 seconds. If soap and water are not available, use hand sanitizer. Avoid contact with people who have cold symptoms. Try not to touch your mouth, nose, or eyes with your hands. Avoid breathing in smoke or chemical fumes. Breathing smoke or chemical fumes will make your condition worse. Get the flu shot every year. Contact a health care provider if: Your symptoms do not improve after 2 weeks. You have trouble coughing up the mucus. Your cough keeps you awake at night. You have a fever. Get help right away if you: Cough up blood. Feel pain in your chest. Have severe shortness of breath. Faint or keep feeling like you are going to faint. Have a severe headache. Have a fever or chills that get worse. These symptoms may represent a serious problem that is an emergency. Do not wait to see if the symptoms will go away. Get medical help right away. Call your local emergency services (911 in the [place] Do not drive yourself to the hospital. Summary Acute bronchitis is inflammation of the  main airways (bronchi) that come off the windpipe (trachea) in the lungs. The swelling causes the airways to get smaller and make more mucus than normal. Drinking more fluids can help thin your mucus so it is easier to cough up. Take over-the-counter and prescription medicines only as told by your health care provider. Do not use any products that contain nicotine or tobacco. These products include cigarettes, chewing tobacco, and vaping devices, such as e-cigarettes. If you need help quitting, ask your health care provider. Contact a health care provider if your symptoms do not improve after 2 weeks. This information is not intended to replace advice given to you by your health care provider. Make sure you discuss any questions you have with your health care provider. Document Revised: 07/09/2021 Document Reviewed: 07/30/2020 Elsevier  Patient Education  2024 [employer]   If you have been instructed to have an in-person evaluation today at a local Urgent Care facility, please use the link below. It will take you to a list of all of our available [facility], including address, phone number and hours of operation. Please do not delay care.  [facility]  If you or a family member do not have a primary care provider, use the link below to schedule a visit and establish care. When you choose a [facility] primary care physician or advanced practice provider, you gain a long-term partner in health. Find a Primary Care Provider  Learn more about [facility]'s in-office and virtual care options: [facility] - Get Care Now

## 2023-12-05 NOTE — Telephone Encounter (Signed)
 FYI Only or Action Required?: FYI only for provider.  Patient was last seen in primary care on 11/07/2023 by Joesph Annabella HERO, FNP.  Called Nurse Triage reporting Covid exposure Cough.  Symptoms began a week ago.  Interventions attempted: Nothing.  Symptoms are: gradually worsening.  Triage Disposition: See HCP Within 4 Hours (Or PCP Triage)  Patient/caregiver understands and will follow disposition?: yes Virtual UC visit had exposure to covid from wife        Copied from CRM #[id number]. Topic: Clinical - Red Word Triage >> Dec 05, 2023  8:04 AM Merlynn LABOR wrote: Red Word that prompted transfer to Nurse Triage: Possible Covid/Coughing up blood Reason for Disposition  Wheezing is present  Answer Assessment - Initial Assessment Questions 1. ONSET: When did the cough begin?      Tuesday of last week  2. SEVERITY: How bad is the cough today?      Hard cough  3. SPUTUM: Describe the color of your sputum (e.g., none, dry cough; clear, white, yellow, green)     Yellow and white with a dark pink tint  4. HEMOPTYSIS: Are you coughing up any blood? If Yes, ask: How much? (e.g., flecks, streaks, tablespoons, etc.)     Blood tint late Friday night stopped  5. DIFFICULTY BREATHING: Are you having difficulty breathing? If Yes, ask: How bad is it? (e.g., mild, moderate, severe)      Mild  6. FEVER: Do you have a fever? If Yes, ask: What is your temperature, how was it measured, and when did it start?     no 7. CARDIAC HISTORY: Do you have any history of heart disease? (e.g., heart attack, congestive heart failure)      Chest pressure, wheezing at times 8. LUNG HISTORY: Do you have any history of lung disease?  (e.g., pulmonary embolus, asthma, emphysema)     No  10. OTHER SYMPTOMS: Do you have any other symptoms? (e.g., runny nose, wheezing, chest pain)       Runny nose, chest pain , SOB  Protocols used: Cough - Acute Productive-A-AH

## 2023-12-05 NOTE — Progress Notes (Signed)
 Virtual Visit Consent   Duane Barrera, you are scheduled for a virtual visit with a [facility] provider today. Just as with appointments in the office, your consent must be obtained to participate. Your consent will be active for this visit and any virtual visit you may have with one of our providers in the next 365 days. If you have a MyChart account, a copy of this consent can be sent to you electronically.  As this is a virtual visit, video technology does not allow for your provider to perform a traditional examination. This may limit your provider's ability to fully assess your condition. If your provider identifies any concerns that need to be evaluated in person or the need to arrange testing (such as labs, EKG, etc.), we will make arrangements to do so. Although advances in technology are sophisticated, we cannot ensure that it will always work on either your end or our end. If the connection with a video visit is poor, the visit may have to be switched to a telephone visit. With either a video or telephone visit, we are not always able to ensure that we have a secure connection.  By engaging in this virtual visit, you consent to the provision of healthcare and authorize for your insurance to be billed (if applicable) for the services provided during this visit. Depending on your insurance coverage, you may receive a charge related to this service.  I need to obtain your verbal consent now. Are you willing to proceed with your visit today? Duane Barrera has provided verbal consent on 12/05/2023 for a virtual visit (video or telephone). Duane [name], PA-C  Date: 12/05/2023 8:57 AM   Virtual Visit via Video Note   I, Duane Barrera, connected with  Duane Barrera  ([id number], 1970-09-05) on 12/05/23 at  8:45 AM EDT by a video-enabled telemedicine application and verified that I am speaking with the correct person using two identifiers.  Location: Patient: Virtual Visit  Location Patient: Home Provider: Virtual Visit Location Provider: Home Office   I discussed the limitations of evaluation and management by telemedicine and the availability of in person appointments. The patient expressed understanding and agreed to proceed.    History of Present Illness: Duane Barrera is a 53 y.o. who identifies as a male who was assigned male at birth, and is being seen today for cough.  HPI: Cough This is a new problem. The current episode started in the past 7 days (Symptoms started Tuesday, 11/29/23; symptoms of fevers, chills, body aches were worst wedsnesday and thursday last week; since Friday lung symptoms of congestion, cough and wheezing have been worsening). The problem has been gradually worsening. The problem occurs constantly. The cough is Productive of sputum and productive of purulent sputum (can gag from cough). Associated symptoms include chest pain (tightness), chills (last week), ear congestion, ear pain (friday, improved), a fever (weds, thurs last week), headaches, hemoptysis, myalgias, nasal congestion, postnasal drip, rhinorrhea, a sore throat, shortness of breath and wheezing. The symptoms are aggravated by lying down. He has tried a beta-agonist inhaler (dayquil, nyquil, tessalon , albuterol inhaler, astelin nasal spray) for the symptoms.    Seen by [facility] virtual on 12/02/23 and diagnosed with Bronchitis, given Tessalon  perles, albuterol, astelin nasal spray, and Mucinex .  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    Allergies:  Allergies  Allergen Reactions    Farxiga  [Dapagliflozin ] Itching   Metformin  And Related Other (See Comments)    GI issues, fatigue   Ozempic  (0.25 Or 0.5 Mg-Dose) [Semaglutide (0.25 Or 0.5mg -Dos)]     GERD, abdominal pain, constipation   Phenergan  [Promethazine  Hcl] Nausea And Vomiting   Medications:  Current Outpatient Medications:    azithromycin  (ZITHROMAX ) 250 MG tablet, Take 2 tablets on day 1, then 1 tablet daily on days 2 through 5, Disp: 6 tablet, Rfl: 0   predniSONE  (DELTASONE ) 20 MG tablet, Take 2 tablets (40 mg total) by mouth daily with breakfast., Disp: 10 tablet, Rfl: 0   Accu-Chek Softclix Lancets lancets, TEST BLOOD SUGAR 4 TIMES   DAILY,, Disp: 100 each, Rfl: 8   blood glucose meter kit and supplies, Dispense based on patient and insurance preference. Use up to four times daily as directed. (FOR ICD-10 E10.9, E11.9)., Disp: 1 each, Rfl: 0   famotidine  (PEPCID ) 20 MG tablet, Take 20 mg by mouth 2 (two) times daily., Disp: , Rfl:    fluticasone  (FLONASE ) 50 MCG/ACT nasal spray, Place 1 spray into both nostrils daily., Disp: 48 mL, Rfl: 2   glucose blood (ACCU-CHEK GUIDE TEST) test strip, Test BS in the morning, at noon & at bedtime Dx E11.65, Disp: 300 each, Rfl: 3   saxagliptin  HCl (ONGLYZA) 5 MG TABS tablet, Take 1 tablet (5 mg total) by mouth daily., Disp: 30 tablet, Rfl: 1  Observations/Objective: Patient is well-developed, well-nourished in no acute distress.  Resting comfortably at home.  Head is normocephalic, atraumatic.  No labored breathing.  Speech is clear and coherent with logical content.  Patient is alert and oriented at baseline.    Assessment and Plan: 1. Acute bacterial bronchitis (Primary) - azithromycin  (ZITHROMAX ) 250 MG tablet; Take 2 tablets on day 1, then 1 tablet daily on days 2 through 5  Dispense: 6 tablet; Refill: 0 - predniSONE  (DELTASONE ) 20 MG tablet; Take 2 tablets (40 mg total) by mouth daily with breakfast.  Dispense: 10 tablet; Refill: 0  - Worsening over a week  despite OTC medications - Will treat with Z-pack and Prednisone -advised to monitor glucose and can await a day or two to see if antibiotic offers enough relief (last A1c was 6.9) - Can continue Mucinex   - Continue Albuterol inhaler as needed - Push fluids.  - Rest.  - Steam and humidifier can help - Seek in person evaluation if worsening or symptoms fail to improve    Follow Up Instructions: I discussed the assessment and treatment plan with the patient. The patient was provided an opportunity to ask questions and all were answered. The patient agreed with the plan and demonstrated an understanding of the instructions.  A copy of instructions were sent to the patient via MyChart unless otherwise noted below.    The patient was advised to call back or seek an in-person evaluation if the symptoms worsen or if the condition fails to improve as anticipated.    Duane [name], PA-C

## 2024-02-10 ENCOUNTER — Encounter: Payer: Self-pay | Admitting: Family Medicine

## 2024-02-10 ENCOUNTER — Ambulatory Visit: Admitting: Family Medicine

## 2024-02-10 VITALS — BP 124/75 | HR 96 | Temp 97.0°F | Ht 70.0 in | Wt 320.8 lb

## 2024-02-10 DIAGNOSIS — Z1211 Encounter for screening for malignant neoplasm of colon: Secondary | ICD-10-CM

## 2024-02-10 DIAGNOSIS — K219 Gastro-esophageal reflux disease without esophagitis: Secondary | ICD-10-CM | POA: Diagnosis not present

## 2024-02-10 DIAGNOSIS — E1165 Type 2 diabetes mellitus with hyperglycemia: Secondary | ICD-10-CM | POA: Diagnosis not present

## 2024-02-10 DIAGNOSIS — Z7984 Long term (current) use of oral hypoglycemic drugs: Secondary | ICD-10-CM

## 2024-02-10 DIAGNOSIS — Z6841 Body Mass Index (BMI) 40.0 and over, adult: Secondary | ICD-10-CM

## 2024-02-10 LAB — BAYER DCA HB A1C WAIVED: HB A1C (BAYER DCA - WAIVED): 6.6 % — ABNORMAL HIGH (ref 4.8–5.6)

## 2024-02-10 MED ORDER — SAXAGLIPTIN HCL 5 MG PO TABS: 5.0000 mg | ORAL_TABLET | Freq: Every day | ORAL | 5 refills | Status: AC

## 2024-02-10 NOTE — Progress Notes (Addendum)
 Established Patient Office Visit  Subjective   Patient ID: Duane Barrera, male    DOB: 1970-06-04  Age: 53 y.o. MRN: [id number]  Chief Complaint  Patient presents with   Medical Management of Chronic Issues    HPI  History of Present Illness   Duane Barrera is a 53 year old male with type 2 diabetes who presents for routine follow-up.  Glycemic control - Type 2 diabetes managed with saxagliptin , which is covered by insurance. - Morning blood glucose levels approximately 135 mg/dL. - Postprandial blood glucose levels rise to 175-190 mg/dL. - Last hemoglobin A1c was 6.9%; current A1c is 6.6%. - No regular exercise. - Diet has remained consistent.  Gastroesophageal reflux symptoms - Acid reflux managed with famotidine  once daily. - Minimal symptoms with this regimen  Weight changes - Approximately four pound weight loss since last visit.        ROS As per HPI.    Objective:     BP 124/75   Pulse 96   Temp (!) 97 F (36.1 C) (Temporal)   Ht 5' 10 (1.778 m)   Wt (!) 320 lb 12.8 oz (145.5 kg)   SpO2 96%   BMI 46.03 kg/m  Wt Readings from Last 3 Encounters:  02/10/24 (!) 320 lb 12.8 oz (145.5 kg)  11/07/23 (!) 324 lb 12.8 oz (147.3 kg)  07/28/23 (!) 322 lb (146.1 kg)      Physical Exam Vitals and nursing note reviewed.  Constitutional:      General: He is not in acute distress.    Appearance: He is obese. He is not ill-appearing, toxic-appearing or diaphoretic.  Cardiovascular:     Rate and Rhythm: Normal rate and regular rhythm.     Heart sounds: Normal heart sounds. No murmur heard. Pulmonary:     Effort: Pulmonary effort is normal. No respiratory distress.     Breath sounds: Normal breath sounds.  Abdominal:     General: Bowel sounds are normal. There is no distension.     Palpations: Abdomen is soft.     Tenderness: There is no abdominal tenderness. There is no guarding or rebound.  Musculoskeletal:     Right lower leg: No edema.     Left  lower leg: No edema.  Skin:    General: Skin is warm and dry.  Neurological:     General: No focal deficit present.     Mental Status: He is alert and oriented to person, place, and time.  Psychiatric:        Mood and Affect: Mood normal.        Behavior: Behavior normal.      No results found for any visits on 02/10/24.    The 10-year ASCVD risk score (Arnett DK, et al., 2019) is: 8.4%    Assessment & Plan:   Type 2 diabetes mellitus with hyperglycemia, without long-term current use of insulin  (HCC) -     Bayer DCA Hb A1c Waived -     sAXagliptin  HCl; Take 1 tablet (5 mg total) by mouth daily.  Dispense: 30 tablet; Refill: 5  Gastroesophageal reflux disease, unspecified whether esophagitis present  Morbid obesity (HCC)  Screening for colon cancer -     Cologuard  Assessment and Plan    Type 2 diabetes mellitus Well-controlled with A1c of 6.6. Morning blood sugars ~135 mg/dL, postprandial 824-809 mg/dL. On saxagliptin . Lost four pounds. Has declined ACE/ARB and statin.  - Continue saxagliptin  as prescribed.  - Follow-up in  four months.  Gastroesophageal reflux disease Well-managed with famotidine . Symptoms controlled. - Continue famotidine  once daily.     Morbid obesity Diet, exercise, weight loss. Weight down 4 lbs.     Return in about 4 months (around 06/09/2024) for chronic follow up.   The patient indicates understanding of these issues and agrees with the plan.   Annabella CHRISTELLA Search, FNP

## 2024-03-01 ENCOUNTER — Ambulatory Visit: Payer: Self-pay | Admitting: Family Medicine

## 2024-03-01 LAB — COLOGUARD: COLOGUARD: NEGATIVE

## 2024-05-08 ENCOUNTER — Other Ambulatory Visit: Payer: Self-pay | Admitting: Family Medicine

## 2024-05-08 DIAGNOSIS — E1165 Type 2 diabetes mellitus with hyperglycemia: Secondary | ICD-10-CM

## 2024-06-08 ENCOUNTER — Ambulatory Visit: Admitting: Family Medicine
